# Patient Record
Sex: Female | Born: 1990 | Race: White | Hispanic: No | Marital: Single | Smoking: Former smoker
Health system: Southern US, Community
[De-identification: ages and names within clinical notes are randomized; demographics above are authoritative.]

## PROBLEM LIST (undated history)

## (undated) DIAGNOSIS — K219 Gastro-esophageal reflux disease without esophagitis: Secondary | ICD-10-CM

## (undated) DIAGNOSIS — F419 Anxiety disorder, unspecified: Secondary | ICD-10-CM

---
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
## 2012-09-12 ENCOUNTER — Encounter: Payer: Self-pay | Admitting: *Deleted

## 2012-09-12 ENCOUNTER — Telehealth: Payer: Self-pay | Admitting: Nurse Practitioner

## 2012-09-12 ENCOUNTER — Encounter: Payer: Self-pay | Admitting: Nurse Practitioner

## 2012-09-12 ENCOUNTER — Ambulatory Visit (INDEPENDENT_AMBULATORY_CARE_PROVIDER_SITE_OTHER): Payer: BC Managed Care – PPO

## 2012-09-12 ENCOUNTER — Ambulatory Visit (INDEPENDENT_AMBULATORY_CARE_PROVIDER_SITE_OTHER): Payer: BC Managed Care – PPO | Admitting: Nurse Practitioner

## 2012-09-12 VITALS — BP 115/81 | HR 69 | Temp 98.2°F | Ht 63.5 in | Wt 143.0 lb

## 2012-09-12 DIAGNOSIS — R109 Unspecified abdominal pain: Secondary | ICD-10-CM

## 2012-09-12 DIAGNOSIS — J029 Acute pharyngitis, unspecified: Secondary | ICD-10-CM

## 2012-09-12 DIAGNOSIS — N390 Urinary tract infection, site not specified: Secondary | ICD-10-CM

## 2012-09-12 DIAGNOSIS — K5900 Constipation, unspecified: Secondary | ICD-10-CM

## 2012-09-12 LAB — POCT URINALYSIS DIPSTICK
Bilirubin, UA: NEGATIVE
Ketones, UA: NEGATIVE
Leukocytes, UA: NEGATIVE
pH, UA: 8

## 2012-09-12 LAB — POCT UA - MICROSCOPIC ONLY
Mucus, UA: NEGATIVE
WBC, Ur, HPF, POC: NEGATIVE

## 2012-09-12 NOTE — Telephone Encounter (Signed)
Appt made for today

## 2012-09-12 NOTE — Telephone Encounter (Signed)
New note faxed to pt's work

## 2012-09-12 NOTE — Progress Notes (Signed)
  Subjective:    Patient ID: Gabrielle Weber, female    DOB: May 13, 1990, 21 y.o.   MRN: [id number]  HPI patient in today with 2 complaints: 1. Sore throat- started yesterday- Only the right side 2.Left flank pain- started 2 days ago- No dysuria- no frequecy    Review of Systems  Constitutional: Negative for fever.  HENT: Positive for sore throat (right side only). Negative for ear pain, congestion, rhinorrhea, postnasal drip and sinus pressure.   Eyes: Negative.   Respiratory: Negative.   Cardiovascular: Negative.   Gastrointestinal: Positive for abdominal pain (left flank).  Genitourinary: Negative for dysuria, urgency and frequency.       Objective:   Physical Exam  Constitutional: She appears well-developed and well-nourished.  HENT:  Right Ear: Hearing, tympanic membrane, external ear and ear canal normal.  Left Ear: Hearing, tympanic membrane, external ear and ear canal normal.  Nose: Mucosal edema and rhinorrhea present. Right sinus exhibits no maxillary sinus tenderness and no frontal sinus tenderness. Left sinus exhibits no maxillary sinus tenderness and no frontal sinus tenderness.  Mouth/Throat: Posterior oropharyngeal erythema present. Posterior oropharyngeal edema: right side only.  Eyes: Conjunctivae are normal. Pupils are equal, round, and reactive to light.  Neck: Normal range of motion. Neck supple.  Cardiovascular: Normal rate and normal heart sounds.   Pulmonary/Chest: Effort normal and breath sounds normal.  Abdominal: Soft. There is no tenderness.  Genitourinary:  No CVA tenderness  Lymphadenopathy:    She has no cervical adenopathy.  BP 115/81  Pulse 69  Temp(Src) 98.2 F (36.8 C) (Oral)  Ht 5' 3.5" (1.613 m)  Wt 143 lb (64.864 kg)  BMI 24.93 kg/m2  LMP 08/30/2012   KUB- Moderate amount os stool in distal colon-Preliminary reading by Paulene Floor, FNP  [facility]       Assessment & Plan:  1. UTI (urinary tract infection) Negative UTI Force fluids -  POCT UA - Microscopic Only - POCT urinalysis dipstick - POCT rapid strep A  2. Sore throat Viral Cold beverages, ice cream and popsicles Throat lozgenses Motrin or tylenol OTC if help - POCT rapid strep A  3. Left flank pain  - DG Abd 1 View; Future  4. Constipation MOM OTC and prune juice Force fluids RTO PRN  Mary-Margaret Daphine Deutscher, FNP

## 2012-09-12 NOTE — Patient Instructions (Signed)

## 2012-11-26 ENCOUNTER — Encounter: Payer: Self-pay | Admitting: Family Medicine

## 2012-11-26 ENCOUNTER — Telehealth: Payer: Self-pay | Admitting: Nurse Practitioner

## 2012-11-26 ENCOUNTER — Ambulatory Visit (INDEPENDENT_AMBULATORY_CARE_PROVIDER_SITE_OTHER): Payer: BC Managed Care – PPO | Admitting: Family Medicine

## 2012-11-26 VITALS — BP 132/86 | HR 82 | Temp 97.0°F | Ht 63.5 in | Wt 148.0 lb

## 2012-11-26 DIAGNOSIS — J322 Chronic ethmoidal sinusitis: Secondary | ICD-10-CM

## 2012-11-26 DIAGNOSIS — J029 Acute pharyngitis, unspecified: Secondary | ICD-10-CM

## 2012-11-26 LAB — POCT RAPID STREP A (OFFICE): Rapid Strep A Screen: NEGATIVE

## 2012-11-26 MED ORDER — AZITHROMYCIN 250 MG PO TABS: ORAL_TABLET | ORAL | Status: DC

## 2012-11-26 NOTE — Telephone Encounter (Signed)
Appt given for today 

## 2012-11-26 NOTE — Patient Instructions (Signed)
Take medication as directed Take Tylenol or Advil for aches pains or fever Drink plenty of fluid Take Claritin or Allegra as needed for head congestion Take Mucinex maximum strength one twice daily with a large glass of water

## 2012-11-26 NOTE — Progress Notes (Signed)
  Subjective:    Patient ID: Gabrielle Weber, female    DOB: 04-27-1990, 22 y.o.   MRN: [id number]  HPI Patient has had sinus congestion and sore throat for about 2 days. There's been no fever. She has had a productive cough which occasionally has yellow sputum. The predominant problem is mostly head congestion. Slight ear pain both ears.   Review of Systems  Constitutional: Negative for fever and chills.  HENT: Positive for congestion, sneezing, postnasal drip and sinus pressure.   Eyes: Negative.   Respiratory: Negative.   Cardiovascular: Negative.   Gastrointestinal: Negative.   Endocrine: Negative.   Genitourinary: Negative.   Musculoskeletal: Negative.   Skin: Negative.   Allergic/Immunologic: Negative.   Neurological: Negative.   Hematological: Negative.   Psychiatric/Behavioral: Negative.        Objective:   Physical Exam  Nursing note and vitals reviewed. Constitutional: She is oriented to person, place, and time. She appears well-developed and well-nourished. She appears distressed.  HENT:  Head: Atraumatic.  Right Ear: External ear normal.  Left Ear: External ear normal.  Throat was slightly red posteriorly right greater than left There is nasal congestion bilaterally, there was right and left frontal sinus tenderness as well as ethmoid tenderness.  Eyes: Right eye exhibits no discharge. Left eye exhibits no discharge. No scleral icterus.  Neck: Normal range of motion. Neck supple.  Cardiovascular: Normal rate and regular rhythm.   Pulmonary/Chest: Effort normal and breath sounds normal. No respiratory distress. She has no wheezes. She has no rales.  Musculoskeletal: Normal range of motion.  Lymphadenopathy:    She has cervical adenopathy (cervical adenopathy bilaterally right greater than left).  Neurological: She is alert and oriented to person, place, and time.  Skin: Skin is warm and dry. No rash noted.  Psychiatric: She has a normal mood and affect. Her behavior  is normal. Judgment and thought content normal.  Malaise   Results for orders placed in visit on 11/26/12  POCT RAPID STREP A (OFFICE)      Result Value Range   Rapid Strep A Screen Negative  Negative        Assessment & Plan:  1. Sore throat - POCT rapid strep A - Strep A culture, throat  2. Ethmoid sinusitis - azithromycin (ZITHROMAX) 250 MG tablet; 2 tabs the first day then one daily  Dispense: 6 tablet; Refill: 0  Patient Instructions  Take medication as directed Take Tylenol or Advil for aches pains or fever Drink plenty of fluid Take Claritin or Allegra as needed for head congestion Take Mucinex maximum strength one twice daily with a large glass of water   Nyra Capes MD

## 2012-11-30 LAB — STREP A CULTURE, THROAT

## 2012-12-04 ENCOUNTER — Telehealth: Payer: Self-pay | Admitting: *Deleted

## 2012-12-04 MED ORDER — FLUCONAZOLE 150 MG PO TABS: ORAL_TABLET | ORAL | Status: DC

## 2012-12-04 NOTE — Telephone Encounter (Signed)
Recently took Zpack for strep and believes she has yeast infection.  Has white discharge and it is itching.  Can we please call something in?

## 2012-12-04 NOTE — Telephone Encounter (Signed)
Diflucan 150 #2  tablets one stat and one one week later

## 2012-12-19 ENCOUNTER — Ambulatory Visit (INDEPENDENT_AMBULATORY_CARE_PROVIDER_SITE_OTHER): Payer: BC Managed Care – PPO | Admitting: Nurse Practitioner

## 2012-12-19 ENCOUNTER — Telehealth: Payer: Self-pay | Admitting: Nurse Practitioner

## 2012-12-19 VITALS — BP 125/81 | HR 79 | Temp 99.0°F | Ht 63.5 in | Wt 149.0 lb

## 2012-12-19 DIAGNOSIS — T2013XA Burn of first degree of chin, initial encounter: Secondary | ICD-10-CM

## 2012-12-19 NOTE — Progress Notes (Signed)
  Subjective:    Patient ID: Gabrielle Weber, female    DOB: 22-Aug-1990, 21 y.o.   MRN: [id number]  HPI Patient had some dental work done yesterday- was sprayed on face and in right eye with chloraphorm- SHe went to the eye Dr. Isidore Moos this morning and now wants face looked out.    Review of Systems  All other systems reviewed and are negative.       Objective:   Physical Exam  Constitutional: She appears well-developed and well-nourished.  HENT:  Head:    Cardiovascular: Normal rate, regular rhythm and normal heart sounds.   Pulmonary/Chest: Effort normal and breath sounds normal.  Skin:  Line of small vesicular lesions on right chin     BP 125/81  Pulse 79  Temp(Src) 99 F (37.2 C) (Oral)  Ht 5' 3.5" (1.613 m)  Wt 149 lb (67.586 kg)  BMI 25.98 kg/m2  LMP 11/26/2012      Assessment & Plan:   1. Burn of chin, first degree, initial encounter    Silvadene cream Do not pick at area RTO prn  Mary-Margaret Daphine Deutscher, FNP

## 2012-12-19 NOTE — Telephone Encounter (Signed)
Appt given for today 

## 2012-12-19 NOTE — Patient Instructions (Signed)

## 2013-12-02 ENCOUNTER — Encounter: Payer: Self-pay | Admitting: Family Medicine

## 2013-12-02 ENCOUNTER — Ambulatory Visit: Payer: Self-pay | Admitting: Family Medicine

## 2013-12-02 VITALS — BP 115/73 | HR 70 | Temp 97.3°F | Ht 63.25 in | Wt 158.0 lb

## 2013-12-02 DIAGNOSIS — J069 Acute upper respiratory infection, unspecified: Secondary | ICD-10-CM

## 2013-12-02 MED ORDER — AZITHROMYCIN 250 MG PO TABS: ORAL_TABLET | ORAL | Status: DC

## 2013-12-02 NOTE — Progress Notes (Signed)
   Subjective:    Patient ID: Gabrielle Weber, female    DOB: 1991/03/14, 23 y.o.   MRN: [id number]  HPI This 23 y.o. female presents for evaluation of uri sx's for over a week.   Review of Systems C/o uri sx's No chest pain, SOB, HA, dizziness, vision change, N/V, diarrhea, constipation, dysuria, urinary urgency or frequency, myalgias, arthralgias or rash.     Objective:   Physical Exam  Vital signs noted  Well developed well nourished female.  HEENT - Head atraumatic Normocephalic                Eyes - PERRLA, Conjuctiva - clear Sclera- Clear EOMI                Ears - EAC's Wnl TM's Wnl Gross Hearing WNL                Throat - oropharanx wnl Respiratory - Lungs CTA bilateral Cardiac - RRR S1 and S2 without murmur GI - Abdomen soft Nontender and bowel sounds active x 4 Extremities - No edema. Neuro - Grossly intact.      Assessment & Plan:  URI (upper respiratory infection) - Plan: azithromycin (ZITHROMAX) 250 MG tablet Push po fluids, rest, tylenol and motrin otc prn as directed for fever, arthralgias, and myalgias.  Follow up prn if sx's continue or persist.  Deatra Canter FNP

## 2014-04-16 ENCOUNTER — Encounter: Payer: Self-pay | Admitting: Family

## 2014-04-16 ENCOUNTER — Ambulatory Visit: Payer: Self-pay | Admitting: Family

## 2014-04-16 VITALS — BP 120/78 | HR 70 | Temp 97.6°F | Ht 63.25 in | Wt 162.8 lb

## 2014-04-16 DIAGNOSIS — N390 Urinary tract infection, site not specified: Secondary | ICD-10-CM

## 2014-04-16 DIAGNOSIS — R309 Painful micturition, unspecified: Secondary | ICD-10-CM

## 2014-04-16 LAB — POCT UA - MICROSCOPIC ONLY
Casts, Ur, LPF, POC: NEGATIVE
Crystals, Ur, HPF, POC: NEGATIVE
MUCUS UA: NEGATIVE
RBC, URINE, MICROSCOPIC: NEGATIVE
YEAST UA: NEGATIVE

## 2014-04-16 LAB — POCT URINALYSIS DIPSTICK
BILIRUBIN UA: NEGATIVE
Glucose, UA: NEGATIVE
KETONES UA: NEGATIVE
NITRITE UA: NEGATIVE
PH UA: 8
PROTEIN UA: NEGATIVE
Urobilinogen, UA: NEGATIVE

## 2014-04-16 MED ORDER — NITROFURANTOIN MONOHYD MACRO 100 MG PO CAPS: 100.0000 mg | ORAL_CAPSULE | Freq: Two times a day (BID) | ORAL | Status: DC

## 2014-04-16 NOTE — Progress Notes (Signed)
   Subjective:    Patient ID: Gabrielle Weber, female    DOB: October 05, 1990, 24 y.o.   MRN: [id number]  Dysuria  This is a new problem. The current episode started in the past 7 days (Four days ago). The problem occurs intermittently. The problem has been unchanged. The quality of the pain is described as stabbing. The pain is at a severity of 3/10. Associated symptoms include a discharge, flank pain, frequency, nausea and urgency. Pertinent negatives include no hematuria, hesitancy or vomiting. She has tried increased fluids for the symptoms. The treatment provided mild relief.      Review of Systems  Constitutional: Negative.   HENT: Negative.   Eyes: Negative.   Respiratory: Negative.  Negative for shortness of breath.   Cardiovascular: Negative.  Negative for palpitations.  Gastrointestinal: Positive for nausea. Negative for vomiting.  Endocrine: Negative.   Genitourinary: Positive for dysuria, urgency, frequency and flank pain. Negative for hesitancy and hematuria.  Musculoskeletal: Negative.   Neurological: Negative.  Negative for headaches.  Hematological: Negative.   Psychiatric/Behavioral: Negative.   All other systems reviewed and are negative.      Objective:   Physical Exam  Constitutional: She is oriented to person, place, and time. She appears well-developed and well-nourished. No distress.  Cardiovascular: Normal rate, regular rhythm, normal heart sounds and intact distal pulses.   No murmur heard. Pulmonary/Chest: Effort normal and breath sounds normal. No respiratory distress. She has no wheezes.  Abdominal: Soft. Bowel sounds are normal. She exhibits no distension. There is no tenderness.  Musculoskeletal: Normal range of motion. She exhibits no edema or tenderness.  Neg for CVA tenderness   Neurological: She is alert and oriented to person, place, and time. She has normal reflexes. No cranial nerve deficit.  Skin: Skin is warm and dry.  Psychiatric: She has a normal  mood and affect. Her behavior is normal. Judgment and thought content normal.  Vitals reviewed.     BP 120/78 mmHg  Pulse 70  Temp(Src) 97.6 F (36.4 C) (Oral)  Ht 5' 3.25" (1.607 m)  Wt 162 lb 12.8 oz (73.846 kg)  BMI 28.60 kg/m2  LMP 04/02/2014     Assessment & Plan:  1. Pain with urination - POCT urinalysis dipstick - POCT UA - Microscopic Only  2. Urinary tract infection without hematuria, site unspecified -Force fluids AZO over the counter X2 days RTO prn - nitrofurantoin, macrocrystal-monohydrate, (MACROBID) 100 MG capsule; Take 1 capsule (100 mg total) by mouth 2 (two) times daily.  Dispense: 10 capsule; Refill: 0  Jannifer Rodneyhristy Korbin Notaro, FNP

## 2014-04-16 NOTE — Patient Instructions (Signed)

## 2014-04-21 ENCOUNTER — Encounter: Payer: Self-pay | Admitting: Family

## 2014-04-21 ENCOUNTER — Ambulatory Visit (INDEPENDENT_AMBULATORY_CARE_PROVIDER_SITE_OTHER): Payer: BLUE CROSS/BLUE SHIELD | Admitting: Family

## 2014-04-21 VITALS — BP 124/87 | HR 88 | Temp 98.2°F | Ht 63.25 in | Wt 159.6 lb

## 2014-04-21 DIAGNOSIS — J029 Acute pharyngitis, unspecified: Secondary | ICD-10-CM

## 2014-04-21 LAB — POCT RAPID STREP A (OFFICE): Rapid Strep A Screen: NEGATIVE

## 2014-04-21 MED ORDER — AZITHROMYCIN 250 MG PO TABS: ORAL_TABLET | ORAL | Status: DC

## 2014-04-21 NOTE — Progress Notes (Signed)
Subjective:    Patient ID: Gabrielle DarterMegan Weber, female    DOB: [birth year], 24 y.o.   MRN: [id number]  Sore Throat  This is a new problem. The current episode started in the past 7 days (Friday). The problem has been gradually worsening. Neither side of throat is experiencing more pain than the other. There has been no fever. The pain is at a severity of 7/10. The pain is mild. Associated symptoms include congestion, ear pain, headaches, swollen glands and trouble swallowing. Pertinent negatives include no coughing, ear discharge or shortness of breath. She has had exposure to strep. She has had no exposure to mono. Treatments tried: Mucinex. The treatment provided mild relief.      Review of Systems  Constitutional: Negative.   HENT: Positive for congestion, ear pain and trouble swallowing. Negative for ear discharge.   Eyes: Negative.   Respiratory: Negative.  Negative for cough and shortness of breath.   Cardiovascular: Negative.  Negative for palpitations.  Gastrointestinal: Negative.   Endocrine: Negative.   Genitourinary: Negative.   Musculoskeletal: Negative.   Neurological: Positive for headaches.  Hematological: Negative.   Psychiatric/Behavioral: Negative.   All other systems reviewed and are negative.      Objective:   Physical Exam  Constitutional: She is oriented to person, place, and time. She appears well-developed and well-nourished. No distress.  HENT:  Head: Normocephalic and atraumatic.  Right Ear: External ear normal.  Left Ear: External ear normal.  Mouth/Throat: Oropharyngeal exudate present.  Nasal passage erythemas with mild swelling    Eyes: Pupils are equal, round, and reactive to light.  Neck: Normal range of motion. Neck supple. No thyromegaly present.  Cardiovascular: Normal rate, regular rhythm, normal heart sounds and intact distal pulses.   No murmur heard. Pulmonary/Chest: Effort normal and breath sounds normal. No respiratory distress. She has no  wheezes.  Abdominal: Soft. Bowel sounds are normal. She exhibits no distension. There is no tenderness.  Musculoskeletal: Normal range of motion. She exhibits no edema or tenderness.  Neurological: She is alert and oriented to person, place, and time. She has normal reflexes. No cranial nerve deficit.  Skin: Skin is warm and dry.  Psychiatric: She has a normal mood and affect. Her behavior is normal. Judgment and thought content normal.  Vitals reviewed.     BP 124/87 mmHg  Pulse 88  Temp(Src) 98.2 F (36.8 C) (Oral)  Ht 5' 3.25" (1.607 m)  Wt 159 lb 9.6 oz (72.394 kg)  BMI 28.03 kg/m2  LMP 04/02/2014     Assessment & Plan:  1. Sore throat - POCT rapid strep A - azithromycin (ZITHROMAX) 250 MG tablet; Take 500 mg once, then 250 mg for four days  Dispense: 6 tablet; Refill: 0  2. Acute pharyngitis, unspecified pharyngitis type -- Take meds as prescribed - Use a cool mist humidifier  -Use saline nose sprays frequently -Saline irrigations of the nose can be very helpful if done frequently.  * 4X daily for 1 week*  * Use of a nettie pot can be helpful with this. Follow directions with this* -Force fluids -For any cough or congestion  Use plain Mucinex- regular strength or max strength is fine   * Children- consult with Pharmacist for dosing -For fever or aces or pains- take tylenol or ibuprofen appropriate for age and weight.  * for fevers greater than 101 orally you may alternate ibuprofen and tylenol every  3 hours. -Throat lozenges if help -New toothbrush in 3 days - azithromycin (  ZITHROMAX) 250 MG tablet; Take 500 mg once, then 250 mg for four days  Dispense: 6 tablet; Refill: 0  Jannifer Rodneyhristy Arlynn Stare, FNP

## 2014-04-21 NOTE — Patient Instructions (Addendum)
Pharyngitis Pharyngitis is redness, pain, and swelling (inflammation) of your pharynx.  CAUSES  Pharyngitis is usually caused by infection. Most of the time, these infections are from viruses (viral) and are part of a cold. However, sometimes pharyngitis is caused by bacteria (bacterial). Pharyngitis can also be caused by allergies. Viral pharyngitis may be spread from person to person by coughing, sneezing, and personal items or utensils (cups, forks, spoons, toothbrushes). Bacterial pharyngitis may be spread from person to person by more intimate contact, such as kissing.  SIGNS AND SYMPTOMS  Symptoms of pharyngitis include:   Sore throat.   Tiredness (fatigue).   Low-grade fever.   Headache.  Joint pain and muscle aches.  Skin rashes.  Swollen lymph nodes.  Plaque-like film on throat or tonsils (often seen with bacterial pharyngitis). DIAGNOSIS  Your health care provider will ask you questions about your illness and your symptoms. Your medical history, along with a physical exam, is often all that is needed to diagnose pharyngitis. Sometimes, a rapid strep test is done. Other lab tests may also be done, depending on the suspected cause.  TREATMENT  Viral pharyngitis will usually get better in 3-4 days without the use of medicine. Bacterial pharyngitis is treated with medicines that kill germs (antibiotics).  HOME CARE INSTRUCTIONS   Drink enough water and fluids to keep your urine clear or pale yellow.   Only take over-the-counter or prescription medicines as directed by your health care provider:   If you are prescribed antibiotics, make sure you finish them even if you start to feel better.   Do not take aspirin.   Get lots of rest.   Gargle with 8 oz of salt water ( tsp of salt per 1 qt of water) as often as every 1-2 hours to soothe your throat.   Throat lozenges (if you are not at risk for choking) or sprays may be used to soothe your throat. SEEK MEDICAL  CARE IF:   You have large, tender lumps in your neck.  You have a rash.  You cough up green, yellow-brown, or bloody spit. SEEK IMMEDIATE MEDICAL CARE IF:   Your neck becomes stiff.  You drool or are unable to swallow liquids.  You vomit or are unable to keep medicines or liquids down.  You have severe pain that does not go away with the use of recommended medicines.  You have trouble breathing (not caused by a stuffy nose). MAKE SURE YOU:   Understand these instructions.  Will watch your condition.  Will get help right away if you are not doing well or get worse. Document Released: 03/07/2005 Document Revised: 12/26/2012 Document Reviewed: 11/12/2012 [employer] Patient Information 2015 [employer]. This information is not intended to replace advice given to you by your health care provider. Make sure you discuss any questions you have with your health care provider.  - Take meds as prescribed - Use a cool mist humidifier  -Use saline nose sprays frequently -Saline irrigations of the nose can be very helpful if done frequently.  * 4X daily for 1 week*  * Use of a nettie pot can be helpful with this. Follow directions with this* -Force fluids -For any cough or congestion  Use plain Mucinex- regular strength or max strength is fine   * Children- consult with Pharmacist for dosing -For fever or aces or pains- take tylenol or ibuprofen appropriate for age and weight.  * for fevers greater than 101 orally you may alternate ibuprofen and tylenol every    3 hours. -Throat lozenges if help -New toothbrush in 3 days   Ticia Virgo, FNP  

## 2014-05-19 ENCOUNTER — Encounter: Payer: Self-pay | Admitting: Nurse Practitioner

## 2014-05-19 ENCOUNTER — Ambulatory Visit (INDEPENDENT_AMBULATORY_CARE_PROVIDER_SITE_OTHER): Payer: BLUE CROSS/BLUE SHIELD | Admitting: Nurse Practitioner

## 2014-05-19 VITALS — BP 125/87 | HR 84 | Temp 97.2°F | Ht 63.0 in | Wt 166.0 lb

## 2014-05-19 DIAGNOSIS — J029 Acute pharyngitis, unspecified: Secondary | ICD-10-CM

## 2014-05-19 LAB — POCT RAPID STREP A (OFFICE): RAPID STREP A SCREEN: NEGATIVE

## 2014-05-19 NOTE — Progress Notes (Signed)
  Subjective:     Gabrielle Weber is a 24 y.o. female who presents for evaluation of sore throat. Associated symptoms include nasal blockage, post nasal drip, sinus and nasal congestion and sore throat. Onset of symptoms was 5 days ago, and have been stable since that time. She is drinking moderate amounts of fluids. She has not had a recent close exposure to someone with proven streptococcal pharyngitis.    Review of Systems A comprehensive review of systems was negative except for: Ears, nose, mouth, throat, and face: positive for earaches, nasal congestion, sore throat and nasal discharge.    Objective:    BP 125/87 mmHg  Pulse 84  Temp(Src) 97.2 F (36.2 C) (Oral)  Ht 5\' 3"  (1.6 m)  Wt 166 lb (75.297 kg)  BMI 29.41 kg/m2 General appearance: alert, cooperative and appears stated age Eyes: conjunctivae/corneas clear. PERRL, EOM's intact. Fundi benign. Ears: normal TM's and external ear canals both ears Nose: moderate congestion, turbinates red, inflamed, no sinus tenderness Throat: lips, mucosa, and tongue normal; teeth and gums normal and +2 bilateral tonsil enlargement, injected, uvula midline.  Neck: mild anterior cervical adenopathy and supple, symmetrical, trachea midline Lungs: clear to auscultation bilaterally  Laboratory Results for orders placed or performed in visit on 05/19/14  POCT rapid strep A  Result Value Ref Range   Rapid Strep A Screen Negative Negative    Strep test done. Results:negative.    Assessment:     Viral pharyngitis.    BP 125/87 mmHg  Pulse 84  Temp(Src) 97.2 F (36.2 C) (Oral)  Ht 5\' 3"  (1.6 m)  Wt 166 lb (75.297 kg)  BMI 29.41 kg/m2  Plan:  Force fluids Motrin or tylenol OTC OTC decongestant Throat lozenges if help New toothbrush in 3 days  Mary-Margaret Daphine DeutscherMartin, FNP

## 2014-05-19 NOTE — Patient Instructions (Signed)

## 2014-10-22 ENCOUNTER — Encounter: Payer: Self-pay | Admitting: Family

## 2014-10-22 ENCOUNTER — Ambulatory Visit (INDEPENDENT_AMBULATORY_CARE_PROVIDER_SITE_OTHER): Payer: BLUE CROSS/BLUE SHIELD | Admitting: Family

## 2014-10-22 ENCOUNTER — Encounter (INDEPENDENT_AMBULATORY_CARE_PROVIDER_SITE_OTHER): Payer: Self-pay

## 2014-10-22 VITALS — BP 123/82 | HR 79 | Temp 98.7°F | Ht 63.0 in | Wt 162.0 lb

## 2014-10-22 DIAGNOSIS — N946 Dysmenorrhea, unspecified: Secondary | ICD-10-CM | POA: Diagnosis not present

## 2014-10-22 NOTE — Progress Notes (Signed)
   Subjective:    Patient ID: Gabrielle Weber, female    DOB: [birth year], 24 y.o.   MRN: [id number]  HPI Pt presents to the office today with menstrual cramps. Pt states her cramps were very painful this AM about a 8 out 10. Pt states she had to miss work. Pt states her period is usually slightly painful, but this morning was worse than usual/. Pt states she feels better now. Pt states she took Motrin which "took the edge off". Pt states her menstrual cycle usually last 3-4 days with heavy bleeding. PT states her mother had Endometriosis    Review of Systems  Constitutional: Negative.   HENT: Negative.   Eyes: Negative.   Respiratory: Negative.  Negative for shortness of breath.   Cardiovascular: Negative.  Negative for palpitations.  Gastrointestinal: Negative.   Endocrine: Negative.   Genitourinary: Negative.   Musculoskeletal: Negative.   Neurological: Negative.  Negative for headaches.  Hematological: Negative.   Psychiatric/Behavioral: Negative.   All other systems reviewed and are negative.      Objective:   Physical Exam  Constitutional: She is oriented to person, place, and time. She appears well-developed and well-nourished. No distress.  HENT:  Head: Normocephalic and atraumatic.  Right Ear: External ear normal.  Left Ear: External ear normal.  Nose: Nose normal.  Mouth/Throat: Oropharynx is clear and moist.  Eyes: Pupils are equal, round, and reactive to light.  Neck: Normal range of motion. Neck supple. No thyromegaly present.  Cardiovascular: Normal rate, regular rhythm, normal heart sounds and intact distal pulses.   No murmur heard. Pulmonary/Chest: Effort normal and breath sounds normal. No respiratory distress. She has no wheezes.  Abdominal: Soft. Bowel sounds are normal. She exhibits no distension. There is no tenderness.  Musculoskeletal: Normal range of motion. She exhibits no edema or tenderness.  Neurological: She is alert and oriented to person, place, and  time. She has normal reflexes. No cranial nerve deficit.  Skin: Skin is warm and dry.  Psychiatric: She has a normal mood and affect. Her behavior is normal. Judgment and thought content normal.  Vitals reviewed.   Blood pressure 123/82, pulse 79, temperature 98.7 F (37.1 C), temperature source Oral, height  (1.6 m), weight 162 lb (73.483 kg).       Assessment & Plan:  1. Dysmenorrhea -Pt to take Tylenol or Motrin as needed -Massage lower back -Heating pad as needed -If continue pt may need transvaginal ultrasound? -RTO prn  Jannifer Rodney, FNP

## 2014-10-22 NOTE — Patient Instructions (Signed)
Endometriosis Endometriosis is a condition in which the tissue that lines the uterus (endometrium) grows outside of its normal location. The tissue may grow in many locations close to the uterus, but it commonly grows on the ovaries, fallopian tubes, vagina, or bowel. Because the uterus expels, or sheds, its lining every menstrual cycle, there is bleeding wherever the endometrial tissue is located. This can cause pain because blood is irritating to tissues not normally exposed to it.  CAUSES  The cause of endometriosis is not known.  SIGNS AND SYMPTOMS  Often, there are no symptoms. When symptoms are present, they can vary with the location of the displaced tissue. Various symptoms can occur at different times. Although symptoms occur mainly during a woman's menstrual period, they can also occur midcycle and usually stop with menopause. Some people may go months with no symptoms at all. Symptoms may include:   Back or abdominal pain.   Heavier bleeding during periods.   Pain during intercourse.   Painful bowel movements.   Infertility. DIAGNOSIS  Your health care provider will do a physical exam and ask about your symptoms. Various tests may be done, such as:   Blood tests and urine tests. These are done to help rule out other problems.   Ultrasound. This test is done to look for abnormal tissue.   An X-ray of the lower bowel (barium enema).  Laparoscopy. In this procedure, a thin, lighted tube with a tiny camera on the end (laparoscope) is inserted into your abdomen. This helps your health care provider look for abnormal tissue to confirm the diagnosis. The health care provider may also remove a small piece of tissue (biopsy) from any abnormal tissue found. This tissue sample can then be sent to a lab so it can be looked at under a microscope. TREATMENT  Treatment will vary and may include:   Medicines to relieve pain. Nonsteroidal anti-inflammatory drugs (NSAIDs) are a type of  pain medicine that can help to relieve the pain caused by endometriosis.  Hormonal therapy. When using hormonal therapy, periods are eliminated. This eliminates the monthly exposure to blood by the displaced endometrial tissue.   Surgery. Surgery may sometimes be done to remove the abnormal endometrial tissue. In severe cases, surgery may be done to remove the fallopian tubes, uterus, and ovaries (hysterectomy). HOME CARE INSTRUCTIONS   Take all medicines as directed by your health care provider. Do not take aspirin because it may increase bleeding when you are not on hormonal therapy.   Avoid activities that produce pain, including sexual activity. SEEK MEDICAL CARE IF:  You have pelvic pain before, after, or during your periods.  You have pelvic pain between periods that gets worse during your period.  You have pelvic pain during or after sex.  You have pelvic pain with bowel movements or urination, especially during your period.  You have problems getting pregnant.  You have a fever. SEEK IMMEDIATE MEDICAL CARE IF:   Your pain is severe and is not responding to pain medicine.   You have severe nausea and vomiting, or you cannot keep foods down.   You have pain that is limited to the right lower part of your abdomen.   You have swelling or increasing pain in your abdomen.   You see blood in your stool.  MAKE SURE YOU:   Understand these instructions.  Will watch your condition.  Will get help right away if you are not doing well or get worse. Document Released: 03/04/2000 Document  Revised: 07/22/2013 Document Reviewed: 11/02/2012 [facility] Patient Information 2015 [place], [place]. This information is not intended to replace advice given to you by your health care provider. Make sure you discuss any questions you have with your health care provider. Dysmenorrhea Menstrual cramps (dysmenorrhea) are caused by the muscles of the uterus tightening (contracting) during a  menstrual period. For some women, this discomfort is merely bothersome. For others, dysmenorrhea can be severe enough to interfere with everyday activities for a few days each month. Primary dysmenorrhea is menstrual cramps that last a couple of days when you start having menstrual periods or soon after. This often begins after a teenager starts having her period. As a woman gets older or has a baby, the cramps will usually lessen or disappear. Secondary dysmenorrhea begins later in life, lasts longer, and the pain may be stronger than primary dysmenorrhea. The pain may start before the period and last a few days after the period.  CAUSES  Dysmenorrhea is usually caused by an underlying problem, such as:  The tissue lining the uterus grows outside of the uterus in other areas of the body (endometriosis).  The endometrial tissue, which normally lines the uterus, is found in or grows into the muscular walls of the uterus (adenomyosis).  The pelvic blood vessels are engorged with blood just before the menstrual period (pelvic congestive syndrome).  Overgrowth of cells (polyps) in the lining of the uterus or cervix.  Falling down of the uterus (prolapse) because of loose or stretched ligaments.  Depression.  Bladder problems, infection, or inflammation.  Problems with the intestine, a tumor, or irritable bowel syndrome.  Cancer of the female organs or bladder.  A severely tipped uterus.  A very tight opening or closed cervix.  Noncancerous tumors of the uterus (fibroids).  Pelvic inflammatory disease (PID).  Pelvic scarring (adhesions) from a previous surgery.  Ovarian cyst.  An intrauterine device (IUD) used for birth control. RISK FACTORS You may be at greater risk of dysmenorrhea if:  You are younger than age 15.  You started puberty early.  You have irregular or heavy bleeding.  You have never given birth.  You have a family history of this problem.  You are a  smoker. SIGNS AND SYMPTOMS   Cramping or throbbing pain in your lower abdomen.  Headaches.  Lower back pain.  Nausea or vomiting.  Diarrhea.  Sweating or dizziness.  Loose stools. DIAGNOSIS  A diagnosis is based on your history, symptoms, physical exam, diagnostic tests, or procedures. Diagnostic tests or procedures may include:  Blood tests.  Ultrasonography.  An examination of the lining of the uterus (dilation and curettage, D&C).  An examination inside your abdomen or pelvis with a scope (laparoscopy).  X-rays.  CT scan.  MRI.  An examination inside the bladder with a scope (cystoscopy).  An examination inside the intestine or stomach with a scope (colonoscopy, gastroscopy). TREATMENT  Treatment depends on the cause of the dysmenorrhea. Treatment may include:  Pain medicine prescribed by your health care provider.  Birth control pills or an IUD with progesterone hormone in it.  Hormone replacement therapy.  Nonsteroidal anti-inflammatory drugs (NSAIDs). These may help stop the production of prostaglandins.  Surgery to remove adhesions, endometriosis, ovarian cyst, or fibroids.  Removal of the uterus (hysterectomy).  Progesterone shots to stop the menstrual period.  Cutting the nerves on the sacrum that go to the female organs (presacral neurectomy).  Electric current to the sacral nerves (sacral nerve stimulation).  Antidepressant medicine.  Psychiatric therapy, counseling, or group therapy.  Exercise and physical therapy.  Meditation and yoga therapy.  Acupuncture. HOME CARE INSTRUCTIONS   Only take over-the-counter or prescription medicines as directed by your health care provider.  Place a heating pad or hot water bottle on your lower back or abdomen. Do not sleep with the heating pad.  Use aerobic exercises, walking, swimming, biking, and other exercises to help lessen the cramping.  Massage to the lower back or abdomen may  help.  Stop smoking.  Avoid alcohol and caffeine. SEEK MEDICAL CARE IF:   Your pain does not get better with medicine.  You have pain with sexual intercourse.  Your pain increases and is not controlled with medicines.  You have abnormal vaginal bleeding with your period.  You develop nausea or vomiting with your period that is not controlled with medicine. SEEK IMMEDIATE MEDICAL CARE IF:  You pass out.  Document Released: 03/07/2005 Document Revised: 11/07/2012 Document Reviewed: 08/23/2012 [facility] Patient Information 2015 [place], [place]. This information is not intended to replace advice given to you by your health care provider. Make sure you discuss any questions you have with your health care provider.

## 2015-01-06 ENCOUNTER — Ambulatory Visit (INDEPENDENT_AMBULATORY_CARE_PROVIDER_SITE_OTHER): Payer: BLUE CROSS/BLUE SHIELD | Admitting: Family Medicine

## 2015-01-06 VITALS — BP 121/79 | HR 75 | Temp 97.9°F | Ht 63.0 in | Wt 165.4 lb

## 2015-01-06 DIAGNOSIS — J029 Acute pharyngitis, unspecified: Secondary | ICD-10-CM

## 2015-01-06 DIAGNOSIS — J020 Streptococcal pharyngitis: Secondary | ICD-10-CM

## 2015-01-06 LAB — POCT RAPID STREP A (OFFICE): Rapid Strep A Screen: POSITIVE — AB

## 2015-01-06 MED ORDER — AZITHROMYCIN 250 MG PO TABS: ORAL_TABLET | ORAL | Status: DC

## 2015-01-06 NOTE — Patient Instructions (Signed)

## 2015-01-06 NOTE — Progress Notes (Signed)
   HPI  Patient presents today here with ear pain and sore throat.  Patient explains that she's been sick for about 2 days. She describes slow onset sore throat, bilateral ear pain, and headache today with a little bit of dizziness. She denies nausea, dyspnea, cough, or other symptoms. She states that she was still able to work today but does have some malaise. Her Tmax is 99.8  PMH: Smoking status noted ROS: Per HPI  Objective: BP 121/79 mmHg  Pulse 75  Temp(Src) 97.9 F (36.6 C) (Oral)  Ht 5\' 3"  (1.6 m)  Wt 165 lb 6.4 oz (75.025 kg)  BMI 29.31 kg/m2 Gen: NAD, alert, cooperative with exam HEENT: NCAT, TMs normal bilaterally, left sided tonsil is large with no exudates, nares clear Neck: Nontender lymphadenopathy CV: RRR, good S1/S2, no murmur Resp: CTABL, no wheezes, non-labored Neuro: Alert and oriented, No gross deficits  Assessment and plan:  # Strep pharyngitis Treat with azithromycin, patient has previous intolerance of penicillins  Letter written for 1 day out of work Supportive care with fluids and Tylenol as needed    Murtis SinkSam Bradshaw, MD [facility] 01/06/2015, 5:44 PM

## 2015-02-09 ENCOUNTER — Encounter: Payer: Self-pay | Admitting: Family Medicine

## 2015-02-09 ENCOUNTER — Encounter (INDEPENDENT_AMBULATORY_CARE_PROVIDER_SITE_OTHER): Payer: Self-pay

## 2015-02-09 ENCOUNTER — Ambulatory Visit (INDEPENDENT_AMBULATORY_CARE_PROVIDER_SITE_OTHER): Payer: BLUE CROSS/BLUE SHIELD | Admitting: Family Medicine

## 2015-02-09 VITALS — BP 122/84 | HR 60 | Temp 97.4°F | Ht 63.0 in | Wt 164.4 lb

## 2015-02-09 DIAGNOSIS — J029 Acute pharyngitis, unspecified: Secondary | ICD-10-CM

## 2015-02-09 LAB — POCT RAPID STREP A (OFFICE): Rapid Strep A Screen: NEGATIVE

## 2015-02-09 MED ORDER — GUAIFENESIN-CODEINE 100-10 MG/5ML PO SYRP: 5.0000 mL | ORAL_SOLUTION | ORAL | Status: DC | PRN

## 2015-02-09 MED ORDER — AZITHROMYCIN 250 MG PO TABS: ORAL_TABLET | ORAL | Status: DC

## 2015-02-09 NOTE — Progress Notes (Signed)
Subjective:  Patient ID: Gabrielle Weber, female    DOB: [birth year]  Age: 24 y.o. MRN: [id number]  CC: Sore Throat   HPI Gabrielle Weber presents for Patient presents with  moderate sore throat. Patient reports coughing frequently as well.no sputum noted. There is no fever no chills no sweats. The patient denies being short of breath. Onset wasa few days ago. Gradually worsening in spite of home remedies. Had similar sx last month and was dx as strep.Z pack cleared it.   History Gabrielle Weber has no past medical history on file.   She has no past surgical history on file.   Her family history includes Fibromyalgia in her mother; Hypertension in her father.She reports that she has been smoking Cigarettes.  She has been smoking about 0.50 packs per day. She does not have any smokeless tobacco history on file. She reports that she does not drink alcohol or use illicit drugs.  Outpatient Prescriptions Prior to Visit  Medication Sig Dispense Refill  . azithromycin (ZITHROMAX) 250 MG tablet Take 2 tablets on day 1 and 1 tablet daily after that (Patient not taking: Reported on 02/09/2015) 6 tablet 0   No facility-administered medications prior to visit.    ROS Review of Systems  Constitutional: Negative for fever, chills, activity change and appetite change.  HENT: Positive for ear pain and sore throat. Negative for congestion, ear discharge, hearing loss, nosebleeds, postnasal drip, rhinorrhea, sinus pressure and sneezing.   Respiratory: Positive for cough. Negative for chest tightness and shortness of breath.   Cardiovascular: Negative for chest pain and palpitations.  Skin: Negative for rash.    Objective:  BP 122/84 mmHg  Pulse 60  Temp(Src) 97.4 F (36.3 C) (Oral)  Ht 5\' 3"  (1.6 m)  Wt 164 lb 6.4 oz (74.571 kg)  BMI 29.13 kg/m2  SpO2 98%  LMP 01/20/2015  BP Readings from Last 3 Encounters:  02/09/15 122/84  01/06/15 121/79  10/22/14 123/82    Wt Readings from Last 3 Encounters:    02/09/15 164 lb 6.4 oz (74.571 kg)  01/06/15 165 lb 6.4 oz (75.025 kg)  10/22/14 162 lb (73.483 kg)     Physical Exam  Constitutional: She appears well-developed and well-nourished.  HENT:  Head: Normocephalic and atraumatic.  Right Ear: Tympanic membrane and external ear normal. No decreased hearing is noted.  Left Ear: Tympanic membrane and external ear normal. No decreased hearing is noted.  Nose: Mucosal edema present. Right sinus exhibits no frontal sinus tenderness. Left sinus exhibits no frontal sinus tenderness.  Mouth/Throat: No oropharyngeal exudate or posterior oropharyngeal erythema.  Eyes: Pupils are equal, round, and reactive to light. Right eye exhibits no discharge. Left eye exhibits no discharge.  Neck: Normal range of motion. Neck supple. No Brudzinski's sign noted.  Cardiovascular: Normal rate, regular rhythm and normal heart sounds.   No murmur heard. Pulmonary/Chest: Breath sounds normal. No respiratory distress. She has no wheezes. She has no rales.  Lymphadenopathy:       Head (right side): No preauricular adenopathy present.       Head (left side): No preauricular adenopathy present.    She has no cervical adenopathy.       Right cervical: No superficial cervical adenopathy present.      Left cervical: No superficial cervical adenopathy present.    No results found for: HGBA1C  No results found for: WBC, HGB, HCT, PLT, GLUCOSE, CHOL, TRIG, HDL, LDLDIRECT, LDLCALC, ALT, AST, NA, K, CL, CREATININE, BUN, CO2, TSH, PSA, INR,  GLUF, HGBA1C, MICROALBUR  No results found.  Assessment & Plan:   Gabrielle Weber was seen today for sore throat.  Diagnoses and all orders for this visit:  Sore throat -     POCT rapid strep A  Other orders -     azithromycin (ZITHROMAX) 250 MG tablet; Take 2 tablets on day 1 and 1 tablet daily after that -     guaiFENesin-codeine (CHERATUSSIN AC) 100-10 MG/5ML syrup; Take 5 mLs by mouth every 4 (four) hours as needed for cough.   I am  having Gabrielle Weber start on guaiFENesin-codeine. I am also having her maintain her azithromycin.  Meds ordered this encounter  Medications  . azithromycin (ZITHROMAX) 250 MG tablet    Sig: Take 2 tablets on day 1 and 1 tablet daily after that    Dispense:  6 tablet    Refill:  0  . guaiFENesin-codeine (CHERATUSSIN AC) 100-10 MG/5ML syrup    Sig: Take 5 mLs by mouth every 4 (four) hours as needed for cough.    Dispense:  180 mL    Refill:  0     Follow-up: Return if symptoms worsen or fail to improve.  Mechele Claude, M.D.

## 2015-09-03 ENCOUNTER — Ambulatory Visit (INDEPENDENT_AMBULATORY_CARE_PROVIDER_SITE_OTHER): Payer: BLUE CROSS/BLUE SHIELD | Admitting: Family Medicine

## 2015-09-03 ENCOUNTER — Encounter: Payer: Self-pay | Admitting: Family Medicine

## 2015-09-03 VITALS — BP 126/77 | HR 73 | Temp 98.1°F | Ht 63.0 in | Wt 169.0 lb

## 2015-09-03 DIAGNOSIS — L209 Atopic dermatitis, unspecified: Secondary | ICD-10-CM

## 2015-09-03 DIAGNOSIS — R29898 Other symptoms and signs involving the musculoskeletal system: Secondary | ICD-10-CM | POA: Diagnosis not present

## 2015-09-03 MED ORDER — TRIAMCINOLONE ACETONIDE 0.5 % EX OINT: 1.0000 "application " | TOPICAL_OINTMENT | Freq: Two times a day (BID) | CUTANEOUS | Status: DC

## 2015-09-03 NOTE — Patient Instructions (Signed)
Great to see you!  Try gentle stretches 2-3 times daily for 2-3 weeks, we will be glad to further evaluate if you do not get better or get worse.   For your skin:  I have sent a topical steroid ointment It appears that you have eczema, with the scaling and silvery appearance psoriasis is a possibility but I would not consider it seriously with this small lesion. Please let us know if anything else crops up

## 2015-09-03 NOTE — Progress Notes (Signed)
   HPI  Patient presents today here is hip tightness and rash on the elbows.  Hip tightness Is been going on for about 3-4 weeks, she states that it's her right anterior hip, whenever she is abducting at a 90 angle or lying completely flat she has anterior hip tightness, stating that it feels like it will break, however does not hurt severely. If she is patient relaxes and she can move her leg normally, however when she adducts again she has pain. She has no injury at  onset  Rash Noticed today Bilateral elbows with fleshy papules and silvery scale Her mothers worried about psoriasis No other lesions similar to this one.  Does have a white lesion with no scale on her right upper forearm   PMH: Smoking status noted ROS: Per HPI  Objective: BP 126/77 mmHg  Pulse 73  Temp(Src) 98.1 F (36.7 C) (Oral)  Ht 5\' 3"  (1.6 m)  Wt 169 lb (76.658 kg)  BMI 29.94 kg/m2  LMP 08/13/2015 Gen: NAD, alert, cooperative with exam HEENT: NCAT CV: RRR, good S1/S2, no murmur Resp: CTABL, no wheezes, non-labored Ext: No edema, warm Neuro: Alert and oriented, No gross deficits MSK Negative Pearlean BrownieFaber, reproduction of symptoms with leg outstretched flat and abduction at the hip  Skin Hyperkeratotic papules that are flesh-colored on bilateral elbows with silvery scale  Assessment and plan:  # Hip tightness I suspect muscle tightness, possibly adductor muscle tightness causing pain with abduction With her silvery rash would consider psoriasis and psoriatic arthritis if not improved as expected Reassurance provided, recommended gentle stretching Return to clinic in 2-3 weeks if not improving, plan plain films and follow-up on rash. Consider psoriasis further  # Rash, atopic dermatitis Likely atopic dermatitis, however she does have silvery scale Treating with topical steroids Consider psoriasis, as well as psoriatic arthritis with hip symptom as above, however lesion is quite small and hip  tightness/pain is mild Watchful waiting     Meds ordered this encounter  Medications  . triamcinolone ointment (KENALOG) 0.5 %    Sig: Apply 1 application topically 2 (two) times daily.    Dispense:  30 g    Refill:  0    Murtis SinkSam Akiem Urieta, MD [facility] 09/03/2015, 6:06 PM

## 2015-09-30 ENCOUNTER — Ambulatory Visit (INDEPENDENT_AMBULATORY_CARE_PROVIDER_SITE_OTHER): Payer: BLUE CROSS/BLUE SHIELD | Admitting: Family Medicine

## 2015-09-30 ENCOUNTER — Ambulatory Visit (INDEPENDENT_AMBULATORY_CARE_PROVIDER_SITE_OTHER): Payer: BLUE CROSS/BLUE SHIELD

## 2015-09-30 ENCOUNTER — Encounter: Payer: Self-pay | Admitting: Family Medicine

## 2015-09-30 VITALS — BP 109/81 | HR 68 | Temp 97.8°F | Ht 63.0 in | Wt 167.8 lb

## 2015-09-30 DIAGNOSIS — M25551 Pain in right hip: Secondary | ICD-10-CM

## 2015-09-30 MED ORDER — MELOXICAM 7.5 MG PO TABS: 7.5000 mg | ORAL_TABLET | Freq: Every day | ORAL | Status: DC

## 2015-09-30 NOTE — Progress Notes (Signed)
   Subjective:    Patient ID: Bartolo DarterMegan Maule, female    DOB: 11-27-1990, 25 y.o.   MRN: [id number]  HPI  Patient is here today for right hip/leg pain. Patient states that when she walks she has a burning sensation that goes into the calf when she walks. Symptoms have not changed since she was here previously she has not done the stretching exercises because it hurts. I think the Pain is probably some tightness not related to the hip pain and we talked about some stretching to do for that  Review of Systems  Constitutional: Negative.   HENT: Negative.   Eyes: Negative.   Respiratory: Negative.   Cardiovascular: Negative.   Gastrointestinal: Negative.   Endocrine: Negative.   Genitourinary: Negative.   Musculoskeletal:       Right hip and leg pain    Skin: Negative.   Allergic/Immunologic: Negative.   Neurological: Negative.   Hematological: Negative.   Psychiatric/Behavioral: Negative.      Dep[facility] screen [facility] 09/30/2015 09/03/2015 02/09/2015  Decreased Interest 0 0 0  Down, Depressed, Hopeless 0 0 0  PHQ - 2 Score 0 0 0        There are no active problems to display for this patient.  Outpatient Encounter Prescriptions as of 09/30/2015  Medication Sig  . triamcinolone ointment (KENALOG) 0.5 % Apply 1 application topically 2 (two) times daily.   No facility-administered encounter medications on file as of 09/30/2015.       Objective:   Physical Exam  Constitutional: She appears well-developed and well-nourished.  Musculoskeletal:  Right hip: Pain is anterior where the leg meets the hip. It increases with abduction. She is able to flex and extend the leg normally. There is also pain with internal rotation X-ray of the pelvis shows no bony abnormality    BP 109/81 mmHg  Pulse 68  Temp(Src) 97.8 F (36.6 C) (Oral)  Ht 5\' 3"  (1.6 m)  Wt 167 lb 12.8 oz (76.114 kg)  BMI 29.73 kg/m2  LMP 09/13/2015       Assessment & Plan:  1. 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 There've been no clothing her weight over the area. I suggested we try meloxicam but we will also arrange for appointment sports medicine for consultation - DG Pelvis 1-2 Views; Future - meloxicam (MOBIC) 7.5 MG tablet; Take 1 tablet (7.5 mg total) by mouth daily.  Dispense: 15 tablet; Refill: 0 - Ambulatory referral to Sports Medicine  Frederica KusterStephen M Miller MD

## 2015-10-08 ENCOUNTER — Encounter: Payer: Self-pay | Admitting: Sports Medicine

## 2015-10-08 ENCOUNTER — Ambulatory Visit (INDEPENDENT_AMBULATORY_CARE_PROVIDER_SITE_OTHER): Payer: BLUE CROSS/BLUE SHIELD | Admitting: Sports Medicine

## 2015-10-08 VITALS — BP 139/75 | Ht 63.0 in | Wt 165.0 lb

## 2015-10-08 DIAGNOSIS — M25551 Pain in right hip: Secondary | ICD-10-CM | POA: Diagnosis not present

## 2015-10-08 DIAGNOSIS — M25559 Pain in unspecified hip: Secondary | ICD-10-CM | POA: Insufficient documentation

## 2015-10-08 NOTE — Progress Notes (Signed)
    Reason For Visit: Right Hip Pain   # Patient with hx of 2 months of hip pain. Patient states no issues with walking, pain only with  abduction and with sideway lunges. She denies any inciting event. Does not do any specific exercises. Patient was seen by PCP and given Mobic for pain, however did not take it.  ROS- No swelling, no erythema, no radiation of pain, or numbness.   Past Medical History - No hx of surgeries or medical issues with hip  Reviewed problem list.  Medications- reviewed and updated No additions to family history Social history- patient is a non-smoker   Objective: BP 139/75 mmHg  Ht 5\' 3"  (1.6 m)  Wt 165 lb (04.54074.844 kg)  BMI 29.24 kg/m2  LMP 09/13/2015 Gen: NAD, alert, cooperative with exam Right Hip: No abnormalities noted on inspection, normal leg length, no pain with palpation of hip joint, normal range of motion pain elicited with abduction of right hip. 5 out of 5 strength throughout. FABER and FADIR positive for pain.   Left Hip: No abnormalities noted on inspection, no pain with palpation, normal range of motion, 5 out of 5 strength.  Ultrasound of Right Hip: No obvious tears or hypoechoic change/fluid collection involving the left hip flexors or adductors.  Joint looks normal with no effusion.  Labrum ant appears intact.  Assessment/Plan: See problem based a/p   Hip pain Likely muscular strain, normal ultrasound negative for labral tear  - Will provided hip strengthening exercises  - Follow up in 6 weeks if not improved.

## 2015-10-08 NOTE — Assessment & Plan Note (Addendum)
Likely muscular strain, normal ultrasound negative for labral tear  - Will provided hip strengthening exercises   We reassured patient as she seemed concerned that she could have a tumor or something serious triggering the pain  OK to use the Mobic  - Follow up in 6 weeks if not improved.

## 2015-10-08 NOTE — Patient Instructions (Signed)
Do daily exercises as discussed and follow up in 6 weeks if no improvement

## 2015-11-10 ENCOUNTER — Ambulatory Visit (INDEPENDENT_AMBULATORY_CARE_PROVIDER_SITE_OTHER): Payer: BLUE CROSS/BLUE SHIELD | Admitting: Family

## 2015-11-10 ENCOUNTER — Encounter: Payer: Self-pay | Admitting: Family

## 2015-11-10 VITALS — BP 119/85 | HR 74 | Temp 97.8°F | Ht 63.0 in | Wt 160.8 lb

## 2015-11-10 DIAGNOSIS — J309 Allergic rhinitis, unspecified: Secondary | ICD-10-CM | POA: Diagnosis not present

## 2015-11-10 DIAGNOSIS — G5602 Carpal tunnel syndrome, left upper limb: Secondary | ICD-10-CM | POA: Diagnosis not present

## 2015-11-10 DIAGNOSIS — F411 Generalized anxiety disorder: Secondary | ICD-10-CM

## 2015-11-10 MED ORDER — FLUTICASONE PROPIONATE 50 MCG/ACT NA SUSP: 2.0000 | Freq: Every day | NASAL | 6 refills | Status: DC

## 2015-11-10 MED ORDER — BUSPIRONE HCL 10 MG PO TABS: 10.0000 mg | ORAL_TABLET | Freq: Two times a day (BID) | ORAL | 1 refills | Status: DC

## 2015-11-10 NOTE — Patient Instructions (Signed)

## 2015-11-10 NOTE — Progress Notes (Signed)
Subjective:    Patient ID: Gabrielle DarterMegan Weber, female    DOB: [birth year], 25 y.o.   MRN: [id number]  Otalgia   There is pain in the left ear. This is a new problem. The current episode started in the past 7 days. The problem occurs every few minutes. The problem has been waxing and waning. There has been no fever. The pain is at a severity of 2/10. The pain is mild. Pertinent negatives include no coughing, diarrhea, ear discharge, hearing loss, neck pain, rhinorrhea or sore throat. Treatments tried: claritin. The treatment provided mild relief.  Anxiety  Presents for initial visit. Onset was 1 to 6 months ago. The problem has been waxing and waning. Symptoms include depressed mood, excessive worry, irritability, nervous/anxious behavior and obsessions. Patient reports no shortness of breath or suicidal ideas. Symptoms occur occasionally. The symptoms are aggravated by specific phobias. The quality of sleep is fair.   Her past medical history is significant for anxiety/panic attacks and depression. Past treatments include nothing. The treatment provided no relief.   Pt is complaining of left wrist pain that comes and goes that started a months ago. PT states she has achy pain of 3 out 10. 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    Review of Systems  Constitutional: Positive for irritability.  HENT: Positive for ear pain and sinus pressure. Negative for ear discharge, hearing loss, rhinorrhea and sore throat.   Respiratory: Negative for cough and shortness of breath.   Gastrointestinal: Negative for diarrhea.  Musculoskeletal: Positive for arthralgias (left wrist pain). Negative for neck pain.  Psychiatric/Behavioral: Negative for suicidal ideas. The patient is nervous/anxious.   All other systems reviewed and are negative.      Objective:   Physical Exam  Constitutional: She is oriented to person, place, and time. She appears well-developed and well-nourished. No distress.  HENT:  Head:  Normocephalic and atraumatic.  Right Ear: External ear normal.  Left Ear: External ear normal.  Mouth/Throat: Oropharynx is clear and moist.  Nasal passage erythemas with mild swelling   Neck: Normal range of motion. Neck supple. No thyromegaly present.  Cardiovascular: Normal rate, regular rhythm, normal heart sounds and intact distal pulses.   No murmur heard. Pulmonary/Chest: Effort normal and breath sounds normal. No respiratory distress. She has no wheezes.  Abdominal: Soft. Bowel sounds are normal. She exhibits no distension. There is no tenderness.  Musculoskeletal: Normal range of motion. She exhibits no edema or tenderness.  Negative tinel and phalen  Neurological: She is alert and oriented to person, place, and time.  Skin: Skin is warm and dry.  Psychiatric: She has a normal mood and affect. Her behavior is normal. Judgment and thought content normal.  Vitals reviewed.     BP 119/85   Pulse 74   Temp 97.8 F (36.6 C) (Oral)   Ht 5\' 3"  (1.6 m)   Wt 160 lb 12.8 oz (72.9 kg)   BMI 28.48 kg/m      Assessment & Plan:  1. Allergic rhinitis, unspecified allergic rhinitis type -Continue Claritin daily - fluticasone (FLONASE) 50 MCG/ACT nasal spray; Place 2 sprays into both nostrils daily.  Dispense: 16 g; Refill: 6  2. GAD (generalized anxiety disorder) Pt started on buspar today. Pt does not want to be on anything she "takes daily". PT refuses lexapro. -Stress management discussed - busPIRone (BUSPAR) 10 MG tablet; Take 1 tablet (10 mg total) by mouth 2 (two) times daily.  Dispense: 30 tablet; Refill: 1  3. Carpal tunnel syndrome of left wrist _Motrin prn Brace as needed   Jannifer Rodneyhristy Hawks, FNP

## 2015-11-16 ENCOUNTER — Telehealth: Payer: Self-pay

## 2015-11-16 MED ORDER — MOMETASONE FUROATE 50 MCG/ACT NA SUSP: 2.0000 | Freq: Every day | NASAL | 12 refills | Status: DC

## 2015-11-16 NOTE — Telephone Encounter (Signed)
Nasonex Prescription sent to pharmacy. Insurance denied [employer]

## 2015-12-24 ENCOUNTER — Encounter: Payer: Self-pay | Admitting: Family

## 2015-12-24 ENCOUNTER — Ambulatory Visit (INDEPENDENT_AMBULATORY_CARE_PROVIDER_SITE_OTHER): Payer: BLUE CROSS/BLUE SHIELD | Admitting: Family

## 2015-12-24 VITALS — BP 123/89 | HR 98 | Temp 99.6°F | Ht 63.0 in | Wt 159.6 lb

## 2015-12-24 DIAGNOSIS — J029 Acute pharyngitis, unspecified: Secondary | ICD-10-CM | POA: Diagnosis not present

## 2015-12-24 DIAGNOSIS — J020 Streptococcal pharyngitis: Secondary | ICD-10-CM

## 2015-12-24 LAB — RAPID STREP SCREEN (MED CTR MEBANE ONLY): Strep Gp A Ag, IA W/Reflex: POSITIVE — AB

## 2015-12-24 MED ORDER — AZITHROMYCIN 250 MG PO TABS: ORAL_TABLET | ORAL | 0 refills | Status: DC

## 2015-12-24 NOTE — Patient Instructions (Signed)

## 2015-12-24 NOTE — Progress Notes (Signed)
   Subjective:    Patient ID: Gabrielle Weber, female    DOB: 1990/10/01, 25 y.o.   MRN: [id number]  Sore Throat   This is a new problem. The current episode started in the past 7 days. The problem has been gradually worsening. The pain is worse on the left side. Maximum temperature: 99.06. The pain is at a severity of 7/10. The pain is moderate. Associated symptoms include ear pain, a hoarse voice, swollen glands and trouble swallowing. Pertinent negatives include no congestion, ear discharge or headaches. She has tried acetaminophen for the symptoms. The treatment provided mild relief.      Review of Systems  HENT: Positive for ear pain, hoarse voice and trouble swallowing. Negative for congestion and ear discharge.   Neurological: Negative for headaches.  All other systems reviewed and are negative.      Objective:   Physical Exam  Constitutional: She is oriented to person, place, and time. She appears well-developed and well-nourished. No distress.  HENT:  Head: Normocephalic and atraumatic.  Right Ear: External ear normal.  Left Ear: External ear normal.  Nose: Mucosal edema and rhinorrhea present.  Mouth/Throat: Oropharyngeal exudate, posterior oropharyngeal edema and posterior oropharyngeal erythema present.  Eyes: Pupils are equal, round, and reactive to light.  Neck: Normal range of motion. Neck supple. No thyromegaly present.  Cardiovascular: Normal rate, regular rhythm, normal heart sounds and intact distal pulses.   No murmur heard. Pulmonary/Chest: Effort normal and breath sounds normal. No respiratory distress. She has no wheezes.  Abdominal: Soft. Bowel sounds are normal. She exhibits no distension. There is no tenderness.  Musculoskeletal: Normal range of motion. She exhibits no edema or tenderness.  Neurological: She is alert and oriented to person, place, and time. She has normal reflexes. No cranial nerve deficit.  Skin: Skin is warm and dry.  Psychiatric: She has a  normal mood and affect. Her behavior is normal. Judgment and thought content normal.  Vitals reviewed.     BP 123/89   Pulse 98   Temp 99.6 F (37.6 C) (Oral)   Ht 5\' 3"  (1.6 m)   Wt 159 lb 9.6 oz (72.4 kg)   BMI 28.27 kg/m      Assessment & Plan:  1. Sore throat - [facility])  2. Streptococcal sore throat -- Take meds as prescribed - Use a cool mist humidifier  -Use saline nose sprays frequently -Saline irrigations of the nose can be very helpful if done frequently.  * 4X daily for 1 week*  * Use of a nettie pot can be helpful with this. Follow directions with this* -Force fluids -For any cough or congestion  Use plain Mucinex- regular strength or max strength is fine   * Children- consult with Pharmacist for dosing -For fever or aces or pains- take tylenol or ibuprofen appropriate for age and weight.  * for fevers greater than 101 orally you may alternate ibuprofen and tylenol every  3 hours. -Throat lozenges if help -New toothbrush in 3 days - azithromycin (ZITHROMAX) 250 MG tablet; Take 500 mg once, then 250 mg for four days  Dispense: 6 tablet; Refill: 0  Jannifer Rodneyhristy Abbigayle Toole, FNP

## 2016-05-11 ENCOUNTER — Ambulatory Visit (INDEPENDENT_AMBULATORY_CARE_PROVIDER_SITE_OTHER): Payer: BLUE CROSS/BLUE SHIELD | Admitting: Family Medicine

## 2016-05-11 ENCOUNTER — Encounter: Payer: Self-pay | Admitting: Family Medicine

## 2016-05-11 VITALS — BP 115/81 | HR 57 | Temp 97.5°F | Ht 63.0 in | Wt 165.0 lb

## 2016-05-11 DIAGNOSIS — J0101 Acute recurrent maxillary sinusitis: Secondary | ICD-10-CM

## 2016-05-11 DIAGNOSIS — J020 Streptococcal pharyngitis: Secondary | ICD-10-CM

## 2016-05-11 MED ORDER — AZITHROMYCIN 250 MG PO TABS: ORAL_TABLET | ORAL | 0 refills | Status: DC

## 2016-05-11 NOTE — Progress Notes (Signed)
   Subjective:  Patient ID: Bartolo DarterMegan Cadotte, female    DOB: 05/07/1990  Age: 26 y.o. MRN: [id number]  CC: Sinusitis (pt here today c/o nasal congestion, sneezing, ear fullnes, sinus headache and sinus pressure)   HPI Bartolo DarterMegan Sharrow presents for Symptoms include congestion, facial pain, nasal congestion, no  fever, non productive cough, post nasal drip and sinus pressure with no fever, chills, night sweats or weight loss. Onset of symptoms was a few days ago, gradually worsening since that time. Pt.is drinking moderate amounts of fluids.     History Aundra MilletMegan has no past medical history on file.   She has no past surgical history on file.   Her family history includes Fibromyalgia in her mother; Hypertension in her father.She reports that she quit smoking about 15 months ago. Her smoking use included Cigarettes. She smoked 0.50 packs per day. She has never used smokeless tobacco. She reports that she does not drink alcohol or use drugs.  No current outpatient prescriptions on file prior to visit.   No current facility-administered medications on file prior to visit.     ROS Review of Systems  Constitutional: Negative for activity change, appetite change, chills and fever.  HENT: Positive for congestion, postnasal drip, rhinorrhea and sinus pressure. Negative for ear discharge, ear pain, hearing loss, nosebleeds, sneezing and trouble swallowing.   Respiratory: Negative for chest tightness and shortness of breath.   Cardiovascular: Negative for chest pain and palpitations.  Skin: Negative for rash.    Objective:  BP 115/81   Pulse (!) 57   Temp 97.5 F (36.4 C) (Oral)   Ht 5\' 3"  (1.6 m)   Wt 165 lb (74.8 kg)   LMP 04/27/2016 (Approximate)   BMI 29.23 kg/m   Physical Exam  Constitutional: She appears well-developed and well-nourished.  HENT:  Head: Normocephalic and atraumatic.  Right Ear: Tympanic membrane and external ear normal. No decreased hearing is noted.  Left Ear: Tympanic  membrane and external ear normal. No decreased hearing is noted.  Nose: Mucosal edema present. Right sinus exhibits no frontal sinus tenderness. Left sinus exhibits no frontal sinus tenderness.  Mouth/Throat: No oropharyngeal exudate or posterior oropharyngeal erythema.  Neck: No Brudzinski's sign noted.  Pulmonary/Chest: Breath sounds normal. No respiratory distress.  Lymphadenopathy:       Head (right side): No preauricular adenopathy present.       Head (left side): No preauricular adenopathy present.       Right cervical: No superficial cervical adenopathy present.      Left cervical: No superficial cervical adenopathy present.    Assessment & Plan:   Aundra MilletMegan was seen today for sinusitis.  Diagnoses and all orders for this visit:  Acute recurrent maxillary sinusitis  Streptococcal sore throat -     azithromycin (ZITHROMAX) 250 MG tablet; Take 500 mg once, then 250 mg for four days   I have discontinued Ms. Gunnoe's triamcinolone ointment and busPIRone. I am also having her maintain her azithromycin.  Meds ordered this encounter  Medications  . azithromycin (ZITHROMAX) 250 MG tablet    Sig: Take 500 mg once, then 250 mg for four days    Dispense:  6 tablet    Refill:  0     Follow-up: Return if symptoms worsen or fail to improve.  Mechele ClaudeWarren Chloey Ricard, M.D.

## 2016-06-29 DIAGNOSIS — Z880 Allergy status to penicillin: Secondary | ICD-10-CM | POA: Diagnosis not present

## 2016-06-29 DIAGNOSIS — R509 Fever, unspecified: Secondary | ICD-10-CM | POA: Diagnosis not present

## 2016-06-29 DIAGNOSIS — R000 Tachycardia, unspecified: Secondary | ICD-10-CM | POA: Diagnosis not present

## 2016-06-29 DIAGNOSIS — Z883 Allergy status to other anti-infective agents status: Secondary | ICD-10-CM | POA: Diagnosis not present

## 2016-06-29 DIAGNOSIS — B349 Viral infection, unspecified: Secondary | ICD-10-CM | POA: Diagnosis not present

## 2016-06-29 DIAGNOSIS — R197 Diarrhea, unspecified: Secondary | ICD-10-CM | POA: Diagnosis not present

## 2016-06-29 DIAGNOSIS — Z882 Allergy status to sulfonamides status: Secondary | ICD-10-CM | POA: Diagnosis not present

## 2016-06-29 DIAGNOSIS — Z87891 Personal history of nicotine dependence: Secondary | ICD-10-CM | POA: Diagnosis not present

## 2016-06-30 DIAGNOSIS — M791 Myalgia: Secondary | ICD-10-CM | POA: Diagnosis not present

## 2016-06-30 DIAGNOSIS — Z880 Allergy status to penicillin: Secondary | ICD-10-CM | POA: Diagnosis not present

## 2016-06-30 DIAGNOSIS — F419 Anxiety disorder, unspecified: Secondary | ICD-10-CM | POA: Diagnosis not present

## 2016-06-30 DIAGNOSIS — R197 Diarrhea, unspecified: Secondary | ICD-10-CM | POA: Diagnosis not present

## 2016-06-30 DIAGNOSIS — Z87891 Personal history of nicotine dependence: Secondary | ICD-10-CM | POA: Diagnosis not present

## 2016-06-30 DIAGNOSIS — Z882 Allergy status to sulfonamides status: Secondary | ICD-10-CM | POA: Diagnosis not present

## 2016-06-30 DIAGNOSIS — A020 Salmonella enteritis: Secondary | ICD-10-CM | POA: Diagnosis not present

## 2016-06-30 DIAGNOSIS — R1084 Generalized abdominal pain: Secondary | ICD-10-CM | POA: Diagnosis not present

## 2016-07-28 ENCOUNTER — Encounter: Payer: Self-pay | Admitting: Family

## 2016-07-28 ENCOUNTER — Ambulatory Visit (INDEPENDENT_AMBULATORY_CARE_PROVIDER_SITE_OTHER): Payer: BLUE CROSS/BLUE SHIELD | Admitting: Family

## 2016-07-28 VITALS — BP 130/85 | HR 106 | Temp 98.5°F | Ht 63.0 in | Wt 159.2 lb

## 2016-07-28 DIAGNOSIS — F411 Generalized anxiety disorder: Secondary | ICD-10-CM | POA: Diagnosis not present

## 2016-07-28 MED ORDER — ALPRAZOLAM 0.25 MG PO TABS: 0.2500 mg | ORAL_TABLET | Freq: Two times a day (BID) | ORAL | 2 refills | Status: DC | PRN

## 2016-07-28 MED ORDER — ESCITALOPRAM OXALATE 10 MG PO TABS: 10.0000 mg | ORAL_TABLET | Freq: Every day | ORAL | 5 refills | Status: DC

## 2016-07-28 NOTE — Progress Notes (Signed)
   Subjective:    Patient ID: Gabrielle DarterMegan Weber, female    DOB: 1991-01-11, 26 y.o.   MRN: [id number]  Pt presents to the office today to check BP and Anxiety. Pt states she took her BP with her grandfather's wrist BP machine and it was 145/111. PT states she feels anxious and feels this is causing her BP to be elevated.  Anxiety  Presents for follow-up visit. Symptoms include depressed mood, excessive worry, irritability, nervous/anxious behavior and restlessness. Patient reports no panic. Symptoms occur occasionally.        Review of Systems  Constitutional: Positive for irritability.  Psychiatric/Behavioral: The patient is nervous/anxious.   All other systems reviewed and are negative.      Objective:   Physical Exam  Constitutional: She is oriented to person, place, and time. She appears well-developed and well-nourished. No distress.  HENT:  Head: Normocephalic and atraumatic.  Eyes: Pupils are equal, round, and reactive to light.  Neck: Normal range of motion. Neck supple. No thyromegaly present.  Cardiovascular: Normal rate, regular rhythm, normal heart sounds and intact distal pulses.   No murmur heard. Pulmonary/Chest: Effort normal and breath sounds normal. No respiratory distress. She has no wheezes.  Abdominal: Soft. Bowel sounds are normal. She exhibits no distension. There is no tenderness.  Musculoskeletal: Normal range of motion. She exhibits no edema or tenderness.  Neurological: She is alert and oriented to person, place, and time.  Skin: Skin is warm and dry.  Psychiatric: She has a normal mood and affect. Her behavior is normal. Judgment and thought content normal.  Tearful   Vitals reviewed.     BP 130/85   Pulse (!) 106   Temp 98.5 F (36.9 C) (Oral)   Ht 5\' 3"  (1.6 m)   Wt 159 lb 3.2 oz (72.2 kg)   BMI 28.20 kg/m      Assessment & Plan:  1. GAD (generalized anxiety disorder) PT getting married this weekend and leaving for a cruise next week  Will  give pt Xanax rx small dose and only to use as needed!!!!!! Pt started on lexapro 10 mg today Stress management discussed RTO in 6 weeks  - escitalopram (LEXAPRO) 10 MG tablet; Take 1 tablet (10 mg total) by mouth daily.  Dispense: 30 tablet; Refill: 5 - ALPRAZolam (XANAX) 0.25 MG tablet; Take 1 tablet (0.25 mg total) by mouth 2 (two) times daily as needed for anxiety.  Dispense: 20 tablet; Refill: 2   Jannifer Rodneyhristy Kaedyn Belardo, FNP

## 2016-07-28 NOTE — Patient Instructions (Signed)
Stress and Stress Management Stress is a normal reaction to life events. It is what you feel when life demands more than you are used to or more than you can handle. Some stress can be useful. For example, the stress reaction can help you catch the last bus of the day, study for a test, or meet a deadline at work. But stress that occurs too often or for too long can cause problems. It can affect your emotional health and interfere with relationships and normal daily activities. Too much stress can weaken your immune system and increase your risk for physical illness. If you already have a medical problem, stress can make it worse. What are the causes? All sorts of life events may cause stress. An event that causes stress for one person may not be stressful for another person. Major life events commonly cause stress. These may be positive or negative. Examples include losing your job, moving into a new home, getting married, having a baby, or losing a loved one. Less obvious life events may also cause stress, especially if they occur day after day or in combination. Examples include working long hours, driving in traffic, caring for children, being in debt, or being in a difficult relationship. What are the signs or symptoms? Stress may cause emotional symptoms including, the following:  Anxiety. This is feeling worried, afraid, on edge, overwhelmed, or out of control.  Anger. This is feeling irritated or impatient.  Depression. This is feeling sad, down, helpless, or guilty.  Difficulty focusing, remembering, or making decisions. Stress may cause physical symptoms, including the following:  Aches and pains. These may affect your head, neck, back, stomach, or other areas of your body.  Tight muscles or clenched jaw.  Low energy or trouble sleeping. Stress may cause unhealthy behaviors, including the following:  Eating to feel better (overeating) or skipping meals.  Sleeping too little, too  much, or both.  Working too much or putting off tasks (procrastination).  Smoking, drinking alcohol, or using drugs to feel better. How is this diagnosed? Stress is diagnosed through an assessment by your health care provider. Your health care provider will ask questions about your symptoms and any stressful life events.Your health care provider will also ask about your medical history and may order blood tests or other tests. Certain medical conditions and medicine can cause physical symptoms similar to stress. Mental illness can cause emotional symptoms and unhealthy behaviors similar to stress. Your health care provider may refer you to a mental health professional for further evaluation. How is this treated? Stress management is the recommended treatment for stress.The goals of stress management are reducing stressful life events and coping with stress in healthy ways. Techniques for reducing stressful life events include the following:  Stress identification. Self-monitor for stress and identify what causes stress for you. These skills may help you to avoid some stressful events.  Time management. Set your priorities, keep a calendar of events, and learn to say "no." These tools can help you avoid making too many commitments. Techniques for coping with stress include the following:  Rethinking the problem. Try to think realistically about stressful events rather than ignoring them or overreacting. Try to find the positives in a stressful situation rather than focusing on the negatives.  Exercise. Physical exercise can release both physical and emotional tension. The key is to find a form of exercise you enjoy and do it regularly.  Relaxation techniques. These relax the body and mind. Examples include yoga,  meditation, tai chi, biofeedback, deep breathing, progressive muscle relaxation, listening to music, being out in nature, journaling, and other hobbies. Again, the key is to find one or  more that you enjoy and can do regularly.  Healthy lifestyle. Eat a balanced diet, get plenty of sleep, and do not smoke. Avoid using alcohol or drugs to relax.  Strong support network. Spend time with family, friends, or other people you enjoy being around.Express your feelings and talk things over with someone you trust. Counseling or talktherapy with a mental health professional may be helpful if you are having difficulty managing stress on your own. Medicine is typically not recommended for the treatment of stress.Talk to your health care provider if you think you need medicine for symptoms of stress. Follow these instructions at home:  Keep all follow-up visits as directed by your health care provider.  Take all medicines as directed by your health care provider. Contact a health care provider if:  Your symptoms get worse or you start having new symptoms.  You feel overwhelmed by your problems and can no longer manage them on your own. Get help right away if:  You feel like hurting yourself or someone else. This information is not intended to replace advice given to you by your health care provider. Make sure you discuss any questions you have with your health care provider. Document Released: 08/31/2000 Document Revised: 08/13/2015 Document Reviewed: 10/30/2012 Elsevier Interactive Patient Education  2017 [employer].

## 2016-08-24 ENCOUNTER — Ambulatory Visit (INDEPENDENT_AMBULATORY_CARE_PROVIDER_SITE_OTHER): Payer: BLUE CROSS/BLUE SHIELD | Admitting: Family Medicine

## 2016-08-24 ENCOUNTER — Encounter: Payer: Self-pay | Admitting: Family Medicine

## 2016-08-24 VITALS — BP 157/89 | HR 88 | Temp 98.1°F | Ht 63.0 in | Wt 165.0 lb

## 2016-08-24 DIAGNOSIS — J0120 Acute ethmoidal sinusitis, unspecified: Secondary | ICD-10-CM | POA: Diagnosis not present

## 2016-08-24 MED ORDER — GUAIFENESIN ER 600 MG PO TB12: 600.0000 mg | ORAL_TABLET | Freq: Two times a day (BID) | ORAL | 0 refills | Status: DC

## 2016-08-24 MED ORDER — MOMETASONE FUROATE 50 MCG/ACT NA SUSP: 2.0000 | Freq: Every day | NASAL | 12 refills | Status: DC

## 2016-08-24 MED ORDER — LEVOFLOXACIN 500 MG PO TABS: 500.0000 mg | ORAL_TABLET | Freq: Every day | ORAL | 0 refills | Status: DC

## 2016-08-24 NOTE — Progress Notes (Signed)
Chief Complaint  Patient presents with  . Sinus Problem    pt here today c/o sinus pressure and congestion    HPI  Patient presents today for Symptoms include congestion, facial pain, nasal congestion, non productive cough, post nasal drip and sinus pressure. There is no fever, chills, or sweats. Onset of symptoms was a few days ago, gradually worsening since that time. Has been taking pseudoephedrine with minimal to no relief. She notes that it has been accompanied bit by some lightheadedness and feeling of being off balance. The pain and pressure in the sinuses have been focused in the area around the bridge of her nose. There has been minimal rhinorrhea.   PMH: Smoking status noted ROS: Per HPI  Objective: BP (!) 157/89   Pulse 88   Temp 98.1 F (36.7 C) (Oral)   Ht 5\' 3"  (1.6 m)   Wt 165 lb (74.8 kg)   LMP 08/21/2016 (Exact Date)   BMI 29.23 kg/m  Gen: NAD, alert, cooperative with exam HEENT: NCAT, EOMI, PERRL TMs clear pharynx without erythema. Nasal passages erythematous. Some tenderness over the bridge of the nose and over the maxillary region as well. CV: RRR, good S1/S2, no murmur Resp: CTABL, no wheezes, non-labored Abd: SNTND, BS present, no guarding or organomegaly Ext: No edema, warm Neuro: Alert and oriented, No gross deficits  Assessment and plan:  1. Acute ethmoidal sinusitis, recurrence not specified     Meds ordered this encounter  Medications  . levofloxacin (LEVAQUIN) 500 MG tablet    Sig: Take 1 tablet (500 mg total) by mouth daily. For 10 days    Dispense:  10 tablet    Refill:  0  . guaiFENesin (MUCINEX) 600 MG 12 hr tablet    Sig: Take 1 tablet (600 mg total) by mouth 2 (two) times daily.    Dispense:  20 tablet    Refill:  0  . mometasone (NASONEX) 50 MCG/ACT nasal spray    Sig: Place 2 sprays into the nose daily.    Dispense:  17 g    Refill:  12    No orders of the defined types were placed in this encounter.   Follow up as  needed.  Mechele ClaudeWarren Caiya Bettes, MD

## 2016-08-25 ENCOUNTER — Telehealth: Payer: Self-pay | Admitting: Family Medicine

## 2016-08-25 MED ORDER — AZITHROMYCIN 250 MG PO TABS: ORAL_TABLET | ORAL | 0 refills | Status: DC

## 2016-08-25 NOTE — Telephone Encounter (Signed)
Zpka Prescription sent to pharmacy

## 2016-08-25 NOTE — Telephone Encounter (Signed)
Detailed message left that rx sent to pharmacy. ?

## 2016-10-18 IMAGING — DX DG PELVIS 1-2V
1 series · 1 of 1 positions shown · non-contrast
Comparison: KUB which included a portion of the pelvis dated September 12, 2012

CLINICAL DATA: Pelvic pain greater on the right than on the left;
no report of trauma

EXAM:
PELVIS - 1-2 VIEW

[pelvis ap]
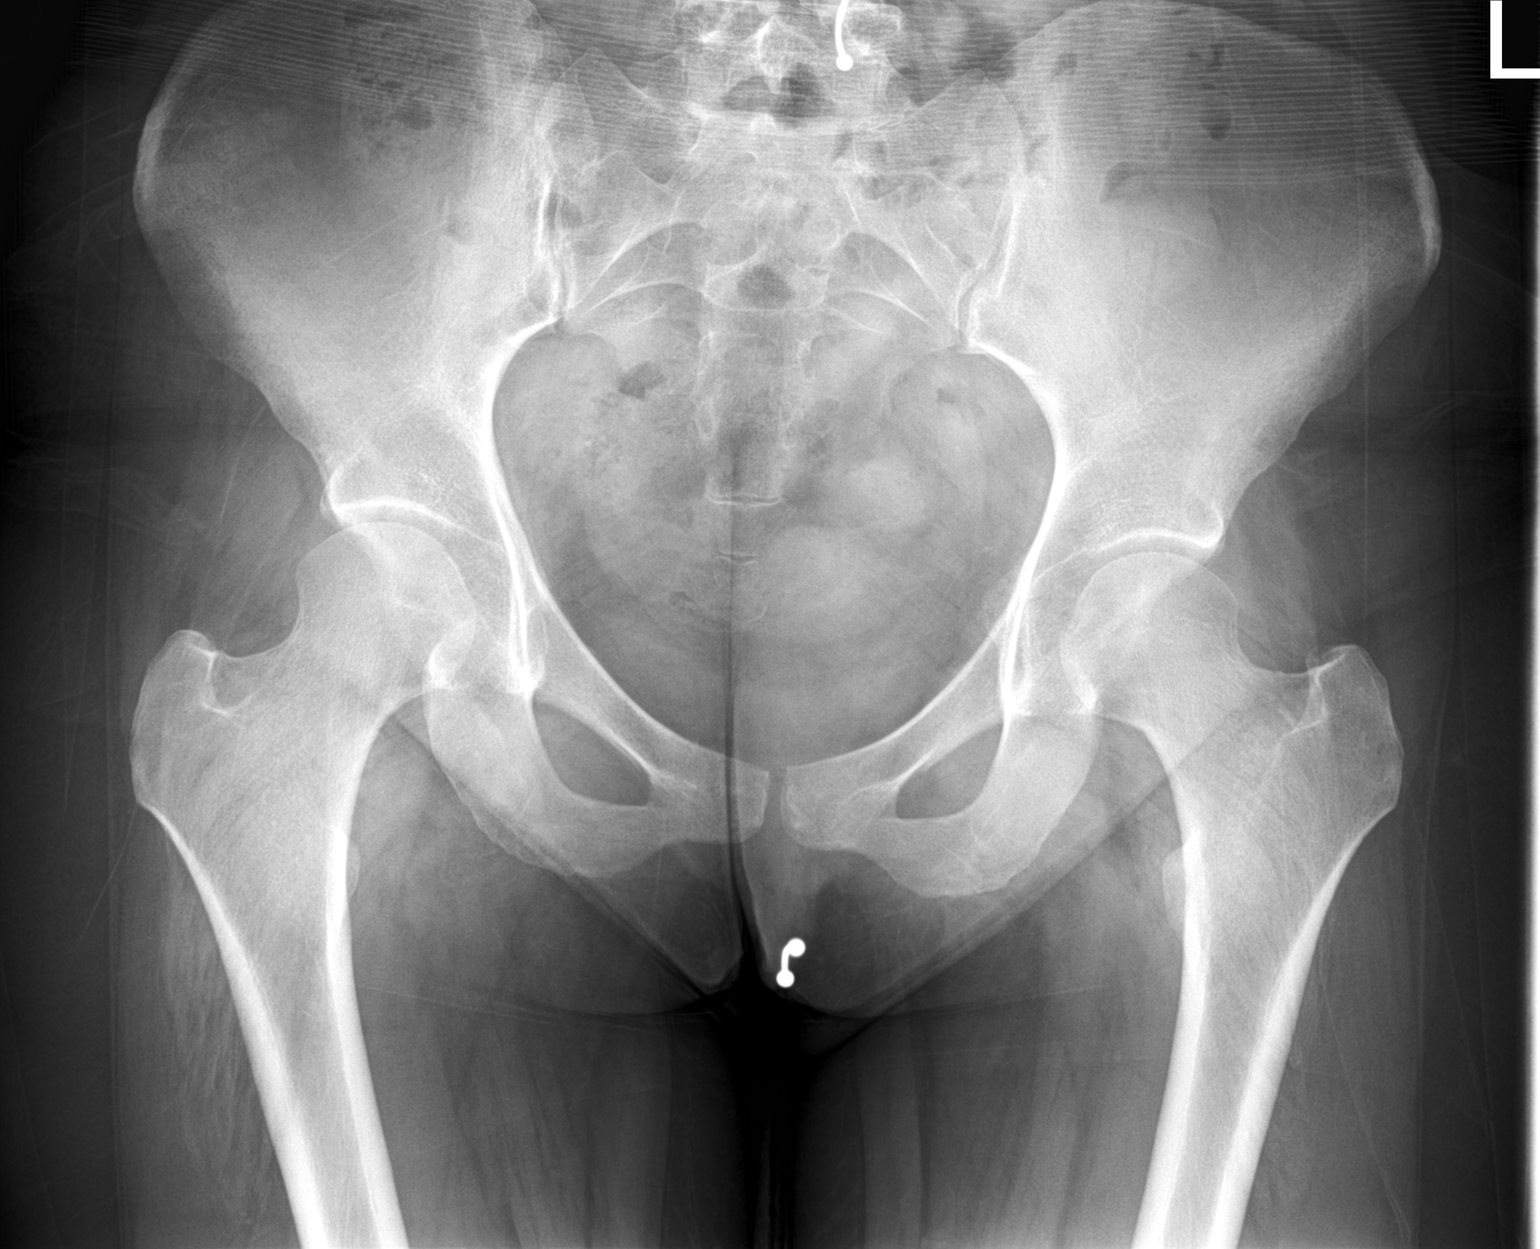

[1 of 1 positions shown; findings below may reference images not displayed]

FINDINGS: The bony pelvis is subjectively adequately mineralized. There is no
acute or healing fracture. There is no lytic nor blastic lesion. The
pubic rami are intact. The observed portions of the sacrum are
normal. The hip joint spaces are reasonably well-maintained. The
proximal femurs appear normal.
IMPRESSION: No acute bony abnormality of the pelvis is observed.

## 2016-11-24 ENCOUNTER — Other Ambulatory Visit: Payer: Self-pay | Admitting: Family

## 2016-11-24 DIAGNOSIS — F411 Generalized anxiety disorder: Secondary | ICD-10-CM

## 2016-11-24 NOTE — Telephone Encounter (Signed)
Refill called to [facility] 

## 2016-12-20 DIAGNOSIS — L709 Acne, unspecified: Secondary | ICD-10-CM | POA: Diagnosis not present

## 2016-12-20 DIAGNOSIS — D229 Melanocytic nevi, unspecified: Secondary | ICD-10-CM | POA: Diagnosis not present

## 2016-12-26 ENCOUNTER — Other Ambulatory Visit: Payer: Self-pay | Admitting: Family

## 2016-12-26 DIAGNOSIS — F411 Generalized anxiety disorder: Secondary | ICD-10-CM

## 2016-12-27 NOTE — Telephone Encounter (Signed)
Xanax refill called to pharmacy. 

## 2017-01-27 ENCOUNTER — Other Ambulatory Visit: Payer: Self-pay | Admitting: Family

## 2017-01-27 DIAGNOSIS — F411 Generalized anxiety disorder: Secondary | ICD-10-CM

## 2017-01-27 NOTE — Telephone Encounter (Signed)
Phoned in.

## 2017-03-10 ENCOUNTER — Other Ambulatory Visit: Payer: Self-pay | Admitting: Family

## 2017-03-10 DIAGNOSIS — F411 Generalized anxiety disorder: Secondary | ICD-10-CM

## 2017-03-10 MED ORDER — ALPRAZOLAM 0.25 MG PO TABS: ORAL_TABLET | ORAL | 0 refills | Status: DC

## 2017-03-15 ENCOUNTER — Other Ambulatory Visit: Payer: Self-pay

## 2017-04-20 ENCOUNTER — Other Ambulatory Visit: Payer: Self-pay | Admitting: Family

## 2017-04-20 DIAGNOSIS — F411 Generalized anxiety disorder: Secondary | ICD-10-CM

## 2018-07-05 ENCOUNTER — Other Ambulatory Visit: Payer: Self-pay | Admitting: Family

## 2018-07-05 DIAGNOSIS — F411 Generalized anxiety disorder: Secondary | ICD-10-CM

## 2018-07-09 ENCOUNTER — Ambulatory Visit: Payer: BLUE CROSS/BLUE SHIELD | Admitting: Family

## 2018-07-12 ENCOUNTER — Telehealth: Payer: Self-pay

## 2018-07-12 ENCOUNTER — Encounter: Payer: Self-pay | Admitting: Family

## 2018-07-12 ENCOUNTER — Ambulatory Visit (INDEPENDENT_AMBULATORY_CARE_PROVIDER_SITE_OTHER): Payer: 59 | Admitting: Family

## 2018-07-12 ENCOUNTER — Other Ambulatory Visit: Payer: Self-pay

## 2018-07-12 DIAGNOSIS — J301 Allergic rhinitis due to pollen: Secondary | ICD-10-CM | POA: Diagnosis not present

## 2018-07-12 DIAGNOSIS — F411 Generalized anxiety disorder: Secondary | ICD-10-CM

## 2018-07-12 DIAGNOSIS — F1320 Sedative, hypnotic or anxiolytic dependence, uncomplicated: Secondary | ICD-10-CM | POA: Diagnosis not present

## 2018-07-12 MED ORDER — FLUTICASONE PROPIONATE 50 MCG/ACT NA SUSP: 2.0000 | Freq: Every day | NASAL | 6 refills | Status: DC

## 2018-07-12 MED ORDER — ALPRAZOLAM 0.25 MG PO TABS
ORAL_TABLET | ORAL | 0 refills | Status: DC
Start: 2018-07-12 — End: 2018-09-18

## 2018-07-12 MED ORDER — CETIRIZINE HCL 10 MG PO TABS: 10.0000 mg | ORAL_TABLET | Freq: Every day | ORAL | 3 refills | Status: DC

## 2018-07-12 MED ORDER — MOMETASONE FUROATE 50 MCG/ACT NA SUSP: 2.0000 | Freq: Every day | NASAL | 12 refills | Status: DC

## 2018-07-12 NOTE — Progress Notes (Signed)
   Virtual Visit via telephone Note  I connected with Gabrielle Weber on 07/12/18 at 12:11 pm  by telephone and verified that I am speaking with the correct person using two identifiers. Gabrielle Weber is currently located at work and no one is currently with her during visit. The provider, Jannifer Rodney, FNP is located in their office at time of visit.  I discussed the limitations, risks, security and privacy concerns of performing an evaluation and management service by telephone and the availability of in person appointments. I also discussed with the patient that there may be a patient responsible charge related to this service. The patient expressed understanding and agreed to proceed.   History and Present Illness:   Pt calls the office today for chronic follow up.  Anxiety  Presents for follow-up visit. Symptoms include decreased concentration, depressed mood, excessive worry, irritability, nervous/anxious behavior, panic and restlessness. Symptoms occur occasionally. The severity of symptoms is moderate. The quality of sleep is good.    Allergic Rhinitis  PT taking zyrtec daily.    Review of Systems  Constitutional: Positive for irritability.  Psychiatric/Behavioral: Positive for decreased concentration. The patient is nervous/anxious.   All other systems reviewed and are negative.      Observations/Objective: No SOB or distress   Assessment and Plan: 1. GAD (generalized anxiety disorder) Discussed lexapro. Does not wish to start at this time. Will refill xanax. Has not had medication since 02/2018. Stress management discussed - ALPRAZolam (XANAX) 0.25 MG tablet; TAKE 1 TABLET TWICE DAILY AS NEEDED FOR ANXIETY  Dispense: 20 tablet; Refill: 0  2. Allergic rhinitis due to pollen, unspecified seasonality Avoid allergens  - mometasone (NASONEX) 50 MCG/ACT nasal spray; Place 2 sprays into the nose daily.  Dispense: 17 g; Refill: 12 - cetirizine (ZYRTEC) 10 MG tablet; Take 1 tablet  (10 mg total) by mouth daily.  Dispense: 90 tablet; Refill: 3  3. Benzodiazepine dependence (HCC)     I discussed the assessment and treatment plan with the patient. The patient was provided an opportunity to ask questions and all were answered. The patient agreed with the plan and demonstrated an understanding of the instructions.   The patient was advised to call back or seek an in-person evaluation if the symptoms worsen or if the condition fails to improve as anticipated.  The above assessment and management plan was discussed with the patient. The patient verbalized understanding of and has agreed to the management plan. Patient is aware to call the clinic if symptoms persist or worsen. Patient is aware when to return to the clinic for a follow-up visit. Patient educated on when it is appropriate to go to the emergency department.    Call ended 12:25 pm, I provided 14 minutes of non-face-to-face time during this encounter.    Jannifer Rodney, FNP

## 2018-07-12 NOTE — Telephone Encounter (Signed)
FYI, Mometasone Furoate is $75.00, pharmacy asking if you could prescribe perhaps Fluticasone instead.

## 2018-09-18 ENCOUNTER — Other Ambulatory Visit: Payer: Self-pay | Admitting: Family

## 2018-09-18 DIAGNOSIS — F411 Generalized anxiety disorder: Secondary | ICD-10-CM

## 2018-10-22 ENCOUNTER — Ambulatory Visit (INDEPENDENT_AMBULATORY_CARE_PROVIDER_SITE_OTHER): Payer: 59 | Admitting: Family Medicine

## 2018-10-22 ENCOUNTER — Other Ambulatory Visit: Payer: Self-pay

## 2018-10-22 ENCOUNTER — Other Ambulatory Visit: Payer: Self-pay | Admitting: Family

## 2018-10-22 ENCOUNTER — Encounter: Payer: Self-pay | Admitting: Family Medicine

## 2018-10-22 DIAGNOSIS — J0100 Acute maxillary sinusitis, unspecified: Secondary | ICD-10-CM

## 2018-10-22 DIAGNOSIS — F411 Generalized anxiety disorder: Secondary | ICD-10-CM

## 2018-10-22 MED ORDER — DM-GUAIFENESIN ER 30-600 MG PO TB12: 1.0000 | ORAL_TABLET | Freq: Two times a day (BID) | ORAL | 0 refills | Status: AC

## 2018-10-22 MED ORDER — FLUTICASONE PROPIONATE 50 MCG/ACT NA SUSP: 2.0000 | Freq: Every day | NASAL | 6 refills | Status: DC

## 2018-10-22 NOTE — Progress Notes (Signed)
Virtual Visit via telephone Note Due to COVID-19 pandemic this visit was conducted virtually. This visit type was conducted due to national recommendations for restrictions regarding the COVID-19 Pandemic (e.g. social distancing, sheltering in place) in an effort to limit this patient's exposure and mitigate transmission in our community. All issues noted in this document were discussed and addressed.  A physical exam was not performed with this format.   I connected with Gabrielle Weber on 10/22/18 at 1530 by telephone and verified that I am speaking with the correct person using two identifiers. Gabrielle Weber is currently located at home and family is currently with them during visit. The provider, Kari BaarsMichelle Olita Takeshita, FNP is located in their office at time of visit.  I discussed the limitations, risks, security and privacy concerns of performing an evaluation and management service by telephone and the availability of in person appointments. I also discussed with the patient that there may be a patient responsible charge related to this service. The patient expressed understanding and agreed to proceed.  Subjective:  Patient ID: Gabrielle Weber, female    DOB: April 24, 1990, 28 y.o.   MRN: [id number]  Chief Complaint:  Sinus Problem   HPI: Gabrielle Weber is a 28 y.o. female presenting on 10/22/2018 for Sinus Problem   Pt reports one day of sinus pressure. States she has pressure below her eyes. She states this was present when she woke up this morning. No cough, fever, sore throat, otalgia, lymphadenopathy, chills, fatigue, or headache. No rhinorrhea or postnasal drainage. States she has been taking Zyrtec daily. Has not been using Flonase as prescribed.   Sinus Problem This is a new problem. The current episode started today. The problem is unchanged. There has been no fever. Her pain is at a severity of 3/10. The pain is mild. Associated symptoms include sinus pressure. Pertinent negatives include no chills,  congestion, coughing, diaphoresis, ear pain, headaches, hoarse voice, neck pain, shortness of breath, sneezing, sore throat or swollen glands. Past treatments include nothing.     Relevant past medical, surgical, family, and social history reviewed and updated as indicated.  Allergies and medications reviewed and updated.   History reviewed. No pertinent past medical history.  History reviewed. No pertinent surgical history.  Social History   Socioeconomic History  . Marital status: Single    Spouse name: Not on file  . Number of children: Not on file  . Years of education: Not on file  . Highest education level: Not on file  Occupational History  . Not on file  Social Needs  . Financial resource strain: Not on file  . Food insecurity    Worry: Not on file    Inability: Not on file  . Transportation needs    Medical: Not on file    Non-medical: Not on file  Tobacco Use  . Smoking status: Former Smoker    Packs/day: 0.50    Types: Cigarettes    Quit date: 01/20/2015    Years since quitting: 3.7  . Smokeless tobacco: Never Used  Substance and Sexual Activity  . Alcohol use: No  . Drug use: No  . Sexual activity: Not on file  Lifestyle  . Physical activity    Days per week: Not on file    Minutes per session: Not on file  . Stress: Not on file  Relationships  . Social Musicianconnections    Talks on phone: Not on file    Gets together: Not on file    Attends  religious service: Not on file    Active member of club or organization: Not on file    Attends meetings of clubs or organizations: Not on file    Relationship status: Not on file  . Intimate partner violence    Fear of current or ex partner: Not on file    Emotionally abused: Not on file    Physically abused: Not on file    Forced sexual activity: Not on file  Other Topics Concern  . Not on file  Social History Narrative  . Not on file    Outpatient Encounter Medications as of 10/22/2018  Medication Sig  .  ALPRAZolam (XANAX) 0.25 MG tablet TAKE 1 TABLET TWICE DAILY AS NEEDED FOR ANXIETY  . cetirizine (ZYRTEC) 10 MG tablet Take 1 tablet (10 mg total) by mouth daily.  Marland Kitchen. dextromethorphan-guaiFENesin (MUCINEX DM) 30-600 MG 12hr tablet Take 1 tablet by mouth 2 (two) times daily for 10 days.  . fluticasone (FLONASE) 50 MCG/ACT nasal spray Place 2 sprays into both nostrils daily.  . [DISCONTINUED] fluticasone (FLONASE) 50 MCG/ACT nasal spray Place 2 sprays into both nostrils daily.   No facility-administered encounter medications on file as of 10/22/2018.     Allergies  Allergen Reactions  . Augmentin [Amoxicillin-Pot Clavulanate]   . Bactrim [Sulfamethoxazole-Trimethoprim]     Review of Systems  Constitutional: Negative for activity change, appetite change, chills, diaphoresis, fatigue, fever and unexpected weight change.  HENT: Positive for sinus pressure. Negative for congestion, ear pain, hoarse voice, postnasal drip, rhinorrhea, sinus pain, sneezing, sore throat, tinnitus, trouble swallowing and voice change.   Eyes: Negative for photophobia and visual disturbance.  Respiratory: Negative for cough and shortness of breath.   Cardiovascular: Negative for chest pain and palpitations.  Gastrointestinal: Negative for abdominal pain, diarrhea, nausea and vomiting.  Genitourinary: Negative for decreased urine volume and difficulty urinating.  Musculoskeletal: Negative for arthralgias, myalgias and neck pain.  Neurological: Negative for dizziness, weakness, light-headedness and headaches.  Psychiatric/Behavioral: Negative for confusion.  All other systems reviewed and are negative.        Observations/Objective: No vital signs or physical exam, this was a telephone or virtual health encounter.  Pt alert and oriented, answers all questions appropriately, and able to speak in full sentences.    Assessment and Plan: Gabrielle Weber was seen today for sinus problem.  Diagnoses and all orders for this  visit:  Acute non-recurrent maxillary sinusitis Reported symptoms consistent with viral sinusitis. No fever, chills, fatigue, double sickening, or other symptoms concerning for acute bacterial infection. Pt insistent upon antibiotic therapy. Long discussion about the importance about avoiding the unnecessary use of antibiotics and the reason antibiotics were not indicated. Pt states she always receives a z-pack. Pt aware we will proceed with symptomatic care. Pt aware if symptoms persist or worsen, she can call back to be reevaluated. Pt aware of symptomatic care including increase water intake, continue Zyrtec, Flonase daily, frequent saline nasal sprays, and Mucinex as needed for cough and congestion. Pt aware if symptoms persist past 5-6 days or if she develops a high fever, she needs to be reevaluated. Pt reluctant to agree to treatment plan but does.  -     fluticasone (FLONASE) 50 MCG/ACT nasal spray; Place 2 sprays into both nostrils daily. -     dextromethorphan-guaiFENesin (MUCINEX DM) 30-600 MG 12hr tablet; Take 1 tablet by mouth 2 (two) times daily for 10 days.     Follow Up Instructions: Return if symptoms worsen or fail to  improve.    I discussed the assessment and treatment plan with the patient. The patient was provided an opportunity to ask questions and all were answered. The patient agreed with the plan and demonstrated an understanding of the instructions.   The patient was advised to call back or seek an in-person evaluation if the symptoms worsen or if the condition fails to improve as anticipated.  The above assessment and management plan was discussed with the patient. The patient verbalized understanding of and has agreed to the management plan. Patient is aware to call the clinic if symptoms persist or worsen. Patient is aware when to return to the clinic for a follow-up visit. Patient educated on when it is appropriate to go to the emergency department.    I provided 15  minutes of non-face-to-face time during this encounter. The call started at 1530. The call ended at 1540. The other time was used for coordination of care.    Monia Pouch, FNP-C [facility] [address] [place], [place] [address] [phone] 10/22/18

## 2018-12-19 ENCOUNTER — Other Ambulatory Visit: Payer: Self-pay | Admitting: Family

## 2018-12-19 DIAGNOSIS — F411 Generalized anxiety disorder: Secondary | ICD-10-CM

## 2019-01-03 ENCOUNTER — Ambulatory Visit (INDEPENDENT_AMBULATORY_CARE_PROVIDER_SITE_OTHER): Payer: 59 | Admitting: Family

## 2019-01-03 ENCOUNTER — Encounter: Payer: Self-pay | Admitting: Family

## 2019-01-03 DIAGNOSIS — J029 Acute pharyngitis, unspecified: Secondary | ICD-10-CM | POA: Diagnosis not present

## 2019-01-03 MED ORDER — AZITHROMYCIN 250 MG PO TABS: ORAL_TABLET | ORAL | 0 refills | Status: DC

## 2019-01-03 NOTE — Progress Notes (Signed)
   Virtual Visit via telephone Note Due to COVID-19 pandemic this visit was conducted virtually. This visit type was conducted due to national recommendations for restrictions regarding the COVID-19 Pandemic (e.g. social distancing, sheltering in place) in an effort to limit this patient's exposure and mitigate transmission in our community. All issues noted in this document were discussed and addressed.  A physical exam was not performed with this format.  I connected with Gabrielle Weber on 01/03/19 at 9:40 AM by telephone and verified that I am speaking with the correct person using two identifiers. Gabrielle Weber is currently located at work and no one is currently with her during visit. The provider, Evelina Dun, FNP is located in their office at time of visit.  I discussed the limitations, risks, security and privacy concerns of performing an evaluation and management service by telephone and the availability of in person appointments. I also discussed with the patient that there may be a patient responsible charge related to this service. The patient expressed understanding and agreed to proceed.   History and Present Illness:  Sore Throat  This is a new problem. The current episode started in the past 7 days. The problem has been gradually worsening. There has been no fever. The pain is at a severity of 6/10. The pain is mild. Associated symptoms include congestion, ear pain, swollen glands and trouble swallowing. Pertinent negatives include no coughing, ear discharge or headaches. Associated symptoms comments: White patches on left tonsil. She has tried NSAIDs (mucinex) for the symptoms. The treatment provided mild relief.      Review of Systems  HENT: Positive for congestion, ear pain and trouble swallowing. Negative for ear discharge.   Respiratory: Negative for cough.   Neurological: Negative for headaches.     Observations/Objective: No SOB or distress noted   Assessment and Plan:  1. Acute pharyngitis, unspecified etiology - Take meds as prescribed - Use a cool mist humidifier  -Use saline nose sprays frequently -Force fluids -For any cough or congestion  Use plain Mucinex- regular strength or max strength is fine -For fever or aces or pains- take tylenol or ibuprofen. -Throat lozenges if help -New toothbrush in 3 days Call office today if symptoms worsen or do not improve  - azithromycin (ZITHROMAX) 250 MG tablet; Take 500 mg once, then 250 mg for four days  Dispense: 6 tablet; Refill: 0     I discussed the assessment and treatment plan with the patient. The patient was provided an opportunity to ask questions and all were answered. The patient agreed with the plan and demonstrated an understanding of the instructions.   The patient was advised to call back or seek an in-person evaluation if the symptoms worsen or if the condition fails to improve as anticipated.  The above assessment and management plan was discussed with the patient. The patient verbalized understanding of and has agreed to the management plan. Patient is aware to call the clinic if symptoms persist or worsen. Patient is aware when to return to the clinic for a follow-up visit. Patient educated on when it is appropriate to go to the emergency department.   Time call ended:  9:50 AM  I provided  10 minutes of non-face-to-face time during this encounter.    Evelina Dun, FNP

## 2019-01-11 ENCOUNTER — Ambulatory Visit (INDEPENDENT_AMBULATORY_CARE_PROVIDER_SITE_OTHER): Payer: 59 | Admitting: Family

## 2019-01-11 ENCOUNTER — Encounter: Payer: Self-pay | Admitting: Family

## 2019-01-11 DIAGNOSIS — K219 Gastro-esophageal reflux disease without esophagitis: Secondary | ICD-10-CM | POA: Diagnosis not present

## 2019-01-11 DIAGNOSIS — F1320 Sedative, hypnotic or anxiolytic dependence, uncomplicated: Secondary | ICD-10-CM | POA: Diagnosis not present

## 2019-01-11 DIAGNOSIS — F411 Generalized anxiety disorder: Secondary | ICD-10-CM | POA: Diagnosis not present

## 2019-01-11 MED ORDER — ALPRAZOLAM 0.25 MG PO TABS: 0.2500 mg | ORAL_TABLET | Freq: Every evening | ORAL | 1 refills | Status: DC | PRN

## 2019-01-11 MED ORDER — OMEPRAZOLE 20 MG PO CPDR: 20.0000 mg | DELAYED_RELEASE_CAPSULE | Freq: Every day | ORAL | 3 refills | Status: DC

## 2019-01-11 NOTE — Progress Notes (Signed)
Virtual Visit via telephone Note Due to COVID-19 pandemic this visit was conducted virtually. This visit type was conducted due to national recommendations for restrictions regarding the COVID-19 Pandemic (e.g. social distancing, sheltering in place) in an effort to limit this patient's exposure and mitigate transmission in our community. All issues noted in this document were discussed and addressed.  A physical exam was not performed with this format.  Attempted to call patient at 3:00 pm with no answer, VM full and unable to leave message.   I connected with Gabrielle Weber on 01/11/19 at 3:28 pm pm by telephone and verified that I am speaking with the correct person using two identifiers. Gabrielle Weber is currently located at work and no one is currently with her during visit. The provider, Jannifer Rodney, FNP is located in their office at time of visit.  I discussed the limitations, risks, security and privacy concerns of performing an evaluation and management service by telephone and the availability of in person appointments. I also discussed with the patient that there may be a patient responsible charge related to this service. The patient expressed understanding and agreed to proceed.   History and Present Illness:  Anxiety Presents for follow-up visit. Symptoms include decreased concentration, depressed mood, excessive worry, irritability, malaise, nervous/anxious behavior and restlessness. Symptoms occur occasionally. The severity of symptoms is moderate. The quality of sleep is good.    Gastroesophageal Reflux She complains of belching and heartburn. This is a chronic problem. The current episode started more than 1 year ago. The problem occurs occasionally. The problem has been waxing and waning. The symptoms are aggravated by certain foods. She has tried a PPI for the symptoms. The treatment provided mild relief.      Review of Systems  Constitutional: Positive for irritability.   Gastrointestinal: Positive for heartburn.  Psychiatric/Behavioral: Positive for decreased concentration. The patient is nervous/anxious.      Observations/Objective: No SOB or distress noted  Assessment and Plan: 1. GAD (generalized anxiety disorder) Pt will come in the next month to sign controlled agreement  Stress management discussed Only take xanax as needed  - omeprazole (PRILOSEC) 20 MG capsule; Take 1 capsule (20 mg total) by mouth daily.  Dispense: 90 capsule; Refill: 3 - ALPRAZolam (XANAX) 0.25 MG tablet; Take 1 tablet (0.25 mg total) by mouth at bedtime as needed for anxiety.  Dispense: 20 tablet; Refill: 1  2. Benzodiazepine dependence (HCC)  3. Gastroesophageal reflux disease, unspecified whether esophagitis present -Diet discussed- Avoid fried, spicy, citrus foods, caffeine and alcohol -Do not eat 2-3 hours before bedtime -Encouraged small frequent meals -Avoid NSAID's - omeprazole (PRILOSEC) 20 MG capsule; Take 1 capsule (20 mg total) by mouth daily.  Dispense: 90 capsule; Refill: 3     I discussed the assessment and treatment plan with the patient. The patient was provided an opportunity to ask questions and all were answered. The patient agreed with the plan and demonstrated an understanding of the instructions.   The patient was advised to call back or seek an in-person evaluation if the symptoms worsen or if the condition fails to improve as anticipated.  The above assessment and management plan was discussed with the patient. The patient verbalized understanding of and has agreed to the management plan. Patient is aware to call the clinic if symptoms persist or worsen. Patient is aware when to return to the clinic for a follow-up visit. Patient educated on when it is appropriate to go to the emergency department.  Time call ended: 3:42 pm    I provided 14 minutes of non-face-to-face time during this encounter.    Gabrielle Dun, FNP

## 2019-06-17 ENCOUNTER — Encounter: Payer: Self-pay | Admitting: Nurse Practitioner

## 2019-06-17 ENCOUNTER — Ambulatory Visit (INDEPENDENT_AMBULATORY_CARE_PROVIDER_SITE_OTHER): Payer: BC Managed Care – PPO | Admitting: Nurse Practitioner

## 2019-06-17 DIAGNOSIS — J0100 Acute maxillary sinusitis, unspecified: Secondary | ICD-10-CM | POA: Diagnosis not present

## 2019-06-17 MED ORDER — AZITHROMYCIN 250 MG PO TABS: ORAL_TABLET | ORAL | 0 refills | Status: DC

## 2019-06-17 NOTE — Progress Notes (Signed)
Virtual Visit via telephone Note Due to COVID-19 pandemic this visit was conducted virtually. This visit type was conducted due to national recommendations for restrictions regarding the COVID-19 Pandemic (e.g. social distancing, sheltering in place) in an effort to limit this patient's exposure and mitigate transmission in our community. All issues noted in this document were discussed and addressed.  A physical exam was not performed with this format.  I connected with Gabrielle Weber on 06/17/19 at 9:05 by telephone and verified that I am speaking with the correct person using two identifiers. Gabrielle Weber is currently located at work and no one is currently with  her during visit. The provider, Mary-Margaret Daphine Deutscher, FNP is located in their office at time of visit.  I discussed the limitations, risks, security and privacy concerns of performing an evaluation and management service by telephone and the availability of in person appointments. I also discussed with the patient that there may be a patient responsible charge related to this service. The patient expressed understanding and agreed to proceed.   History and Present Illness:   Chief Complaint: Sinusitis   HPI Patient calls in today for video visit c/o nasal congestion, and cough that started 5 days ago. She denies.  Cough has gotten sorse and become productive and started out clear but is now is green.   Review of Systems  Constitutional: Negative for chills and fever.  HENT: Positive for congestion, ear pain and sinus pain. Negative for sore throat.   Respiratory: Positive for cough (productive an dis now green).   Musculoskeletal: Negative.   Neurological: Positive for headaches.  Psychiatric/Behavioral: Negative.   All other systems reviewed and are negative.    Observations/Objective: Alert and oriented- answers all questions appropriately No distress Voice is froggy Wet cough noted  Assessment and Plan: Gabrielle Weber  in today with chief complaint of Sinusitis   1. Acute non-recurrent maxillary sinusitis 1. Take meds as prescribed 2. Use a cool mist humidifier especially during the winter months and when heat has been humid. 3. Use saline nose sprays frequently 4. Saline irrigations of the nose can be very helpful if done frequently.  * 4X daily for 1 week*  * Use of a nettie pot can be helpful with this. Follow directions with this* 5. Drink plenty of fluids 6. Keep thermostat turn down low 7.For any cough or congestion  Use plain Mucinex- regular strength or max strength is fine   * Children- consult with Pharmacist for dosing 8. For fever or aces or pains- take tylenol or ibuprofen appropriate for age and weight.  * for fevers greater than 101 orally you may alternate ibuprofen and tylenol every  3 hours.    - azithromycin (ZITHROMAX Z-PAK) 250 MG tablet; As directed  Dispense: 6 tablet; Refill: 0     Follow Up Instructions: prn    I discussed the assessment and treatment plan with the patient. The patient was provided an opportunity to ask questions and all were answered. The patient agreed with the plan and demonstrated an understanding of the instructions.   The patient was advised to call back or seek an in-person evaluation if the symptoms worsen or if the condition fails to improve as anticipated.  The above assessment and management plan was discussed with the patient. The patient verbalized understanding of and has agreed to the management plan. Patient is aware to call the clinic if symptoms persist or worsen. Patient is aware when to return to the clinic for a  follow-up visit. Patient educated on when it is appropriate to go to the emergency department.   Time call ended:  9:15  I provided 10 minutes of non-face-to-face time during this encounter.    Mary-Margaret Hassell Done, FNP

## 2019-08-26 ENCOUNTER — Other Ambulatory Visit: Payer: Self-pay | Admitting: Family

## 2019-08-26 DIAGNOSIS — J301 Allergic rhinitis due to pollen: Secondary | ICD-10-CM

## 2019-08-26 DIAGNOSIS — F411 Generalized anxiety disorder: Secondary | ICD-10-CM

## 2020-01-13 ENCOUNTER — Other Ambulatory Visit: Payer: Self-pay | Admitting: Family

## 2020-01-13 DIAGNOSIS — J301 Allergic rhinitis due to pollen: Secondary | ICD-10-CM

## 2020-01-13 DIAGNOSIS — K219 Gastro-esophageal reflux disease without esophagitis: Secondary | ICD-10-CM

## 2020-01-13 DIAGNOSIS — F411 Generalized anxiety disorder: Secondary | ICD-10-CM

## 2020-05-30 ENCOUNTER — Other Ambulatory Visit: Payer: Self-pay | Admitting: Family

## 2020-05-30 DIAGNOSIS — F411 Generalized anxiety disorder: Secondary | ICD-10-CM

## 2020-05-30 DIAGNOSIS — J301 Allergic rhinitis due to pollen: Secondary | ICD-10-CM

## 2020-05-30 DIAGNOSIS — K219 Gastro-esophageal reflux disease without esophagitis: Secondary | ICD-10-CM

## 2020-06-02 ENCOUNTER — Telehealth: Payer: Self-pay

## 2020-06-02 DIAGNOSIS — K219 Gastro-esophageal reflux disease without esophagitis: Secondary | ICD-10-CM

## 2020-06-02 DIAGNOSIS — J301 Allergic rhinitis due to pollen: Secondary | ICD-10-CM

## 2020-06-02 DIAGNOSIS — F411 Generalized anxiety disorder: Secondary | ICD-10-CM

## 2020-06-02 MED ORDER — CETIRIZINE HCL 10 MG PO TABS: 10.0000 mg | ORAL_TABLET | Freq: Every day | ORAL | 0 refills | Status: DC

## 2020-06-02 MED ORDER — OMEPRAZOLE 20 MG PO CPDR: 20.0000 mg | DELAYED_RELEASE_CAPSULE | Freq: Every day | ORAL | 0 refills | Status: DC

## 2020-06-02 NOTE — Telephone Encounter (Signed)
Pt aware 30 day refill sent to pharmacy

## 2020-06-02 NOTE — Telephone Encounter (Signed)
  Prescription Request  06/02/2020  What is the name of the medication or equipment? Pt made appt for med refill at end of month. She needs her allergy and heart burn meds called in  Have you contacted your pharmacy to request a refill? (if applicable) yes  Which pharmacy would you like this sent to? [facility]   Patient notified that their request is being sent to the clinical staff for review and that they should receive a response within 2 business days.

## 2020-06-11 ENCOUNTER — Encounter: Payer: Self-pay | Admitting: Family

## 2020-06-11 ENCOUNTER — Telehealth: Payer: Self-pay

## 2020-06-11 ENCOUNTER — Ambulatory Visit: Payer: Managed Care, Other (non HMO) | Admitting: Family

## 2020-06-11 DIAGNOSIS — J301 Allergic rhinitis due to pollen: Secondary | ICD-10-CM

## 2020-06-11 DIAGNOSIS — K2100 Gastro-esophageal reflux disease with esophagitis, without bleeding: Secondary | ICD-10-CM

## 2020-06-11 DIAGNOSIS — F411 Generalized anxiety disorder: Secondary | ICD-10-CM | POA: Diagnosis not present

## 2020-06-11 DIAGNOSIS — K219 Gastro-esophageal reflux disease without esophagitis: Secondary | ICD-10-CM

## 2020-06-11 MED ORDER — ALPRAZOLAM 0.25 MG PO TABS: 0.2500 mg | ORAL_TABLET | Freq: Every evening | ORAL | 0 refills | Status: DC | PRN

## 2020-06-11 MED ORDER — OMEPRAZOLE 20 MG PO CPDR: 20.0000 mg | DELAYED_RELEASE_CAPSULE | Freq: Every day | ORAL | 1 refills | Status: DC

## 2020-06-11 MED ORDER — FLUTICASONE PROPIONATE 50 MCG/ACT NA SUSP: 2.0000 | Freq: Every day | NASAL | 6 refills | Status: DC

## 2020-06-11 MED ORDER — CETIRIZINE HCL 10 MG PO TABS: 10.0000 mg | ORAL_TABLET | Freq: Every day | ORAL | 0 refills | Status: DC

## 2020-06-11 NOTE — Progress Notes (Signed)
Virtual Visit via telephone Note Due to COVID-19 pandemic this visit was conducted virtually. This visit type was conducted due to national recommendations for restrictions regarding the COVID-19 Pandemic (e.g. social distancing, sheltering in place) in an effort to limit this patient's exposure and mitigate transmission in our community. All issues noted in this document were discussed and addressed.  A physical exam was not performed with this format.  I connected with Gabrielle Weber on 06/11/20 at 3:47 pm  by telephone and verified that I am speaking with the correct person using two identifiers. Gabrielle Weber is currently located at home and no one  is currently with her during visit. The provider, Jannifer Rodney, FNP is located in their office at time of visit.  I discussed the limitations, risks, security and privacy concerns of performing an evaluation and management service by telephone and the availability of in person appointments. I also discussed with the patient that there may be a patient responsible charge related to this service. The patient expressed understanding and agreed to proceed.    History and Present Illness:  PT calls the office today for chronic follow up. She is currently taking zyrtec and flonase as needed for allergies.   Gastroesophageal Reflux She complains of belching and heartburn. This is a chronic problem. The current episode started more than 1 year ago. The problem occurs occasionally. The problem has been waxing and waning. She has tried a PPI for the symptoms. The treatment provided moderate relief.  Anxiety Presents for follow-up visit. Symptoms include depressed mood, excessive worry, irritability, nervous/anxious behavior and restlessness. Symptoms occur occasionally. The severity of symptoms is moderate. The quality of sleep is good.        Review of Systems  Constitutional: Positive for irritability.  Gastrointestinal: Positive for heartburn.   Psychiatric/Behavioral: The patient is nervous/anxious.   All other systems reviewed and are negative.    Observations/Objective: No SOB or distress noted   Assessment and Plan: 1. GAD (generalized anxiety disorder) - omeprazole (PRILOSEC) 20 MG capsule; Take 1 capsule (20 mg total) by mouth daily.  Dispense: 90 capsule; Refill: 1 - ALPRAZolam (XANAX) 0.25 MG tablet; Take 1 tablet (0.25 mg total) by mouth at bedtime as needed for anxiety.  Dispense: 20 tablet; Refill: 0  2. Gastroesophageal reflux disease with esophagitis, unspecified whether hemorrhage  3. Gastroesophageal reflux disease, unspecified whether esophagitis present - omeprazole (PRILOSEC) 20 MG capsule; Take 1 capsule (20 mg total) by mouth daily.  Dispense: 90 capsule; Refill: 1  4. Allergic rhinitis due to pollen, unspecified seasonality - cetirizine (ZYRTEC) 10 MG tablet; Take 1 tablet (10 mg total) by mouth daily.  Dispense: 30 tablet; Refill: 0 - fluticasone (FLONASE) 50 MCG/ACT nasal spray; Place 2 sprays into both nostrils daily.  Dispense: 16 g; Refill: 6  Will refill medications today She will come in 1 month for contract and pap We will discuss adding buspar as needed at that time Stress management discussed     I discussed the assessment and treatment plan with the patient. The patient was provided an opportunity to ask questions and all were answered. The patient agreed with the plan and demonstrated an understanding of the instructions.   The patient was advised to call back or seek an in-person evaluation if the symptoms worsen or if the condition fails to improve as anticipated.  The above assessment and management plan was discussed with the patient. The patient verbalized understanding of and has agreed to the management plan. Patient  is aware to call the clinic if symptoms persist or worsen. Patient is aware when to return to the clinic for a follow-up visit. Patient educated on when it is appropriate  to go to the emergency department.   Time call ended:  3:57 pm   I provided 11 minutes of non-face-to-face time during this encounter.    Jannifer Rodney, FNP

## 2020-06-11 NOTE — Addendum Note (Signed)
Addended by: Jannifer Rodney A on: 06/11/2020 04:41 PM   Modules accepted: Orders

## 2020-06-15 ENCOUNTER — Ambulatory Visit: Payer: BC Managed Care – PPO | Admitting: Family

## 2020-07-20 ENCOUNTER — Encounter: Payer: Self-pay | Admitting: Family Medicine

## 2021-09-28 ENCOUNTER — Telehealth (INDEPENDENT_AMBULATORY_CARE_PROVIDER_SITE_OTHER): Payer: 59 | Admitting: Family

## 2021-09-28 ENCOUNTER — Encounter: Payer: Self-pay | Admitting: Family

## 2021-09-28 DIAGNOSIS — K2101 Gastro-esophageal reflux disease with esophagitis, with bleeding: Secondary | ICD-10-CM | POA: Insufficient documentation

## 2021-09-28 DIAGNOSIS — F411 Generalized anxiety disorder: Secondary | ICD-10-CM

## 2021-09-28 NOTE — Progress Notes (Signed)
PT has not been seen in person in 5 years. I can not give controlled mediations without face to face visit. She will make face to face visit and we no charge this visit.   Jannifer Rodney, FNP

## 2021-10-15 ENCOUNTER — Ambulatory Visit (INDEPENDENT_AMBULATORY_CARE_PROVIDER_SITE_OTHER): Payer: 59 | Admitting: Family

## 2021-10-15 ENCOUNTER — Encounter: Payer: Self-pay | Admitting: Family

## 2021-10-15 VITALS — BP 123/77 | HR 127 | Temp 98.7°F | Ht 63.0 in | Wt 159.2 lb

## 2021-10-15 DIAGNOSIS — K219 Gastro-esophageal reflux disease without esophagitis: Secondary | ICD-10-CM | POA: Diagnosis not present

## 2021-10-15 DIAGNOSIS — K2101 Gastro-esophageal reflux disease with esophagitis, with bleeding: Secondary | ICD-10-CM | POA: Diagnosis not present

## 2021-10-15 DIAGNOSIS — F411 Generalized anxiety disorder: Secondary | ICD-10-CM | POA: Diagnosis not present

## 2021-10-15 DIAGNOSIS — Z79899 Other long term (current) drug therapy: Secondary | ICD-10-CM

## 2021-10-15 DIAGNOSIS — J301 Allergic rhinitis due to pollen: Secondary | ICD-10-CM | POA: Diagnosis not present

## 2021-10-15 DIAGNOSIS — J029 Acute pharyngitis, unspecified: Secondary | ICD-10-CM

## 2021-10-15 DIAGNOSIS — F1320 Sedative, hypnotic or anxiolytic dependence, uncomplicated: Secondary | ICD-10-CM

## 2021-10-15 MED ORDER — OMEPRAZOLE 20 MG PO CPDR: 20.0000 mg | DELAYED_RELEASE_CAPSULE | Freq: Every day | ORAL | 1 refills | Status: DC

## 2021-10-15 MED ORDER — ALPRAZOLAM 0.25 MG PO TABS: 0.2500 mg | ORAL_TABLET | Freq: Every evening | ORAL | 2 refills | Status: DC | PRN

## 2021-10-15 MED ORDER — CETIRIZINE HCL 10 MG PO TABS: 10.0000 mg | ORAL_TABLET | Freq: Every day | ORAL | 0 refills | Status: DC

## 2021-10-15 MED ORDER — AZITHROMYCIN 250 MG PO TABS: ORAL_TABLET | ORAL | 0 refills | Status: DC

## 2021-10-15 NOTE — Progress Notes (Signed)
Subjective:    Patient ID: Gabrielle Weber, female    DOB: 1990/07/05, 31 y.o.   MRN: [id number]  Chief Complaint  Patient presents with   Gastroesophageal Reflux   Anxiety   Nevus    Left leg wants checked    Pt presents to the office today for chronic follow up. She currently takes xanax as needed such as before flying or panic attacks. One prescription usually lasts her a year.   Reports she is going on a cruise next week.  Gastroesophageal Reflux She complains of belching and heartburn. This is a chronic problem. The current episode started more than 1 year ago. The problem occurs occasionally. She has tried a PPI for the symptoms. The treatment provided mild relief.  Anxiety Presents for follow-up visit. Symptoms include excessive worry, irritability and nervous/anxious behavior. Symptoms occur occasionally. The severity of symptoms is moderate.    Sore Throat  This is a new problem. The current episode started 1 to 4 weeks ago. The problem has been waxing and waning. There has been no fever. The pain is moderate. Pertinent negatives include no ear pain.      Review of Systems  Constitutional:  Positive for irritability.  HENT:  Negative for ear pain.   Gastrointestinal:  Positive for heartburn.  Psychiatric/Behavioral:  The patient is nervous/anxious.   All other systems reviewed and are negative.      Objective:   Physical Exam Vitals reviewed.  Constitutional:      General: She is not in acute distress.    Appearance: She is well-developed.  HENT:     Head: Normocephalic and atraumatic.     Right Ear: Tympanic membrane normal.     Left Ear: Tympanic membrane normal.  Eyes:     Pupils: Pupils are equal, round, and reactive to light.  Neck:     Thyroid: No thyromegaly.  Cardiovascular:     Rate and Rhythm: Normal rate and regular rhythm.     Heart sounds: Normal heart sounds. No murmur heard. Pulmonary:     Effort: Pulmonary effort is normal. No respiratory  distress.     Breath sounds: Normal breath sounds. No wheezing.  Abdominal:     General: Bowel sounds are normal. There is no distension.     Palpations: Abdomen is soft.     Tenderness: There is no abdominal tenderness.  Musculoskeletal:        General: No tenderness. Normal range of motion.     Cervical back: Normal range of motion and neck supple.  Skin:    General: Skin is warm and dry.  Neurological:     Mental Status: She is alert and oriented to person, place, and time.     Cranial Nerves: No cranial nerve deficit.     Deep Tendon Reflexes: Reflexes are normal and symmetric.  Psychiatric:        Mood and Affect: Mood is anxious.        Behavior: Behavior normal.        Thought Content: Thought content normal.        Judgment: Judgment normal.       BP 123/77   Pulse (!) 127   Temp 98.7 F (37.1 C) (Temporal)   Ht 5\' 3"  (1.6 m)   Wt 159 lb 3.2 oz (72.2 kg)   LMP 09/24/2021   BMI 28.20 kg/m      Assessment & Plan:  Gabrielle Weber comes in today with chief complaint of Gastroesophageal Reflux,  Anxiety, and Nevus (Left leg wants checked )   Diagnosis and orders addressed:  1. Allergic rhinitis due to pollen, unspecified seasonality - cetirizine (ZYRTEC) 10 MG tablet; Take 1 tablet (10 mg total) by mouth daily.  Dispense: 30 tablet; Refill: 0  2. GAD (generalized anxiety disorder) - ALPRAZolam (XANAX) 0.25 MG tablet; Take 1 tablet (0.25 mg total) by mouth at bedtime as needed for anxiety.  Dispense: 20 tablet; Refill: 2 - omeprazole (PRILOSEC) 20 MG capsule; Take 1 capsule (20 mg total) by mouth daily.  Dispense: 90 capsule; Refill: 1 - ToxASSURE Select 13 (MW), Urine  3. Gastroesophageal reflux disease, unspecified whether esophagitis present - omeprazole (PRILOSEC) 20 MG capsule; Take 1 capsule (20 mg total) by mouth daily.  Dispense: 90 capsule; Refill: 1  4. Gastroesophageal reflux disease with esophagitis and hemorrhage  5. Benzodiazepine dependence  (HCC)  6. Controlled substance agreement signed - ToxASSURE Select 13 (MW), Urine  7. Acute pharyngitis, unspecified etiology - azithromycin (ZITHROMAX) 250 MG tablet; Take 500 mg once, then 250 mg for four days  Dispense: 6 tablet; Refill: 0   Labs refused. Patient reviewed in [place] controlled database, no flags noted. Contract and drug screen up dated today.  Health Maintenance reviewed Diet and exercise encouraged  Follow up plan: 3 months    Jannifer Rodney, FNP

## 2021-10-15 NOTE — Patient Instructions (Signed)
Gastroesophageal Reflux Disease, Adult Gastroesophageal reflux (GER) happens when acid from the stomach flows up into the tube that connects the mouth and the stomach (esophagus). Normally, food travels down the esophagus and stays in the stomach to be digested. However, when a person has GER, food and stomach acid sometimes move back up into the esophagus. If this becomes a more serious problem, the person may be diagnosed with a disease called gastroesophageal reflux disease (GERD). GERD occurs when the reflux: Happens often. Causes frequent or severe symptoms. Causes problems such as damage to the esophagus. When stomach acid comes in contact with the esophagus, the acid may cause inflammation in the esophagus. Over time, GERD may create small holes (ulcers) in the lining of the esophagus. What are the causes? This condition is caused by a problem with the muscle between the esophagus and the stomach (lower esophageal sphincter, or LES). Normally, the LES muscle closes after food passes through the esophagus to the stomach. When the LES is weakened or abnormal, it does not close properly, and that allows food and stomach acid to go back up into the esophagus. The LES can be weakened by certain dietary substances, medicines, and medical conditions, including: Tobacco use. Pregnancy. Having a hiatal hernia. Alcohol use. Certain foods and beverages, such as coffee, chocolate, onions, and peppermint. What increases the risk? You are more likely to develop this condition if you: Have an increased body weight. Have a connective tissue disorder. Take NSAIDs, such as ibuprofen. What are the signs or symptoms? Symptoms of this condition include: Heartburn. Difficult or painful swallowing and the feeling of having a lump in the throat. A bitter taste in the mouth. Bad breath and having a large amount of saliva. Having an upset or bloated stomach and belching. Chest pain. Different conditions can  cause chest pain. Make sure you see your health care provider if you experience chest pain. Shortness of breath or wheezing. Ongoing (chronic) cough or a nighttime cough. Wearing away of tooth enamel. Weight loss. How is this diagnosed? This condition may be diagnosed based on a medical history and a physical exam. To determine if you have mild or severe GERD, your health care provider may also monitor how you respond to treatment. You may also have tests, including: A test to examine your stomach and esophagus with a small camera (endoscopy). A test that measures the acidity level in your esophagus. A test that measures how much pressure is on your esophagus. A barium swallow or modified barium swallow test to show the shape, size, and functioning of your esophagus. How is this treated? Treatment for this condition may vary depending on how severe your symptoms are. Your health care provider may recommend: Changes to your diet. Medicine. Surgery. The goal of treatment is to help relieve your symptoms and to prevent complications. Follow these instructions at home: Eating and drinking  Follow a diet as recommended by your health care provider. This may involve avoiding foods and drinks such as: Coffee and tea, with or without caffeine. Drinks that contain alcohol. Energy drinks and sports drinks. Carbonated drinks or sodas. Chocolate and cocoa. Peppermint and mint flavorings. Garlic and onions. Horseradish. Spicy and acidic foods, including peppers, chili powder, curry powder, vinegar, hot sauces, and barbecue sauce. Citrus fruit juices and citrus fruits, such as oranges, lemons, and limes. Tomato-based foods, such as red sauce, chili, salsa, and pizza with red sauce. Fried and fatty foods, such as donuts, french fries, potato chips, and high-fat dressings.   High-fat meats, such as hot dogs and fatty cuts of red and white meats, such as rib eye steak, sausage, ham, and  bacon. High-fat dairy items, such as whole milk, butter, and cream cheese. Eat small, frequent meals instead of large meals. Avoid drinking large amounts of liquid with your meals. Avoid eating meals during the 2-3 hours before bedtime. Avoid lying down right after you eat. Do not exercise right after you eat. Lifestyle  Do not use any products that contain nicotine or tobacco. These products include cigarettes, chewing tobacco, and vaping devices, such as e-cigarettes. If you need help quitting, ask your health care provider. Try to reduce your stress by using methods such as yoga or meditation. If you need help reducing stress, ask your health care provider. If you are overweight, reduce your weight to an amount that is healthy for you. Ask your health care provider for guidance about a safe weight loss goal. General instructions Pay attention to any changes in your symptoms. Take over-the-counter and prescription medicines only as told by your health care provider. Do not take aspirin, ibuprofen, or other NSAIDs unless your health care provider told you to take these medicines. Wear loose-fitting clothing. Do not wear anything tight around your waist that causes pressure on your abdomen. Raise (elevate) the head of your bed about 6 inches (15 cm). You can use a wedge to do this. Avoid bending over if this makes your symptoms worse. Keep all follow-up visits. This is important. Contact a health care provider if: You have: New symptoms. Unexplained weight loss. Difficulty swallowing or it hurts to swallow. Wheezing or a persistent cough. A hoarse voice. Your symptoms do not improve with treatment. Get help right away if: You have sudden pain in your arms, neck, jaw, teeth, or back. You suddenly feel sweaty, dizzy, or light-headed. You have chest pain or shortness of breath. You vomit and the vomit is green, yellow, or black, or it looks like blood or coffee grounds. You faint. You  have stool that is red, bloody, or black. You cannot swallow, drink, or eat. These symptoms may represent a serious problem that is an emergency. Do not wait to see if the symptoms will go away. Get medical help right away. Call your local emergency services (911 in the [place] Do not drive yourself to the hospital. Summary Gastroesophageal reflux happens when acid from the stomach flows up into the esophagus. GERD is a disease in which the reflux happens often, causes frequent or severe symptoms, or causes problems such as damage to the esophagus. Treatment for this condition may vary depending on how severe your symptoms are. Your health care provider may recommend diet and lifestyle changes, medicine, or surgery. Contact a health care provider if you have new or worsening symptoms. Take over-the-counter and prescription medicines only as told by your health care provider. Do not take aspirin, ibuprofen, or other NSAIDs unless your health care provider told you to do so. Keep all follow-up visits as told by your health care provider. This is important. This information is not intended to replace advice given to you by your health care provider. Make sure you discuss any questions you have with your health care provider. Document Revised: 09/16/2019 Document Reviewed: 09/16/2019 Elsevier Patient Education  2023 [employer] Inc[employer]  

## 2021-10-21 LAB — TOXASSURE SELECT 13 (MW), URINE

## 2022-01-13 ENCOUNTER — Other Ambulatory Visit: Payer: Self-pay | Admitting: Family

## 2022-01-13 DIAGNOSIS — J301 Allergic rhinitis due to pollen: Secondary | ICD-10-CM

## 2022-01-13 MED ORDER — CETIRIZINE HCL 10 MG PO TABS: 10.0000 mg | ORAL_TABLET | Freq: Every day | ORAL | 1 refills | Status: DC

## 2022-02-21 ENCOUNTER — Ambulatory Visit: Payer: 59 | Admitting: Family

## 2022-02-21 ENCOUNTER — Encounter: Payer: Self-pay | Admitting: Family

## 2022-02-21 VITALS — BP 136/80 | HR 88 | Temp 98.0°F | Ht 63.0 in | Wt 145.0 lb

## 2022-02-21 DIAGNOSIS — R1013 Epigastric pain: Secondary | ICD-10-CM

## 2022-02-21 DIAGNOSIS — K2101 Gastro-esophageal reflux disease with esophagitis, with bleeding: Secondary | ICD-10-CM

## 2022-02-21 MED ORDER — SUCRALFATE 1 G PO TABS: 1.0000 g | ORAL_TABLET | Freq: Three times a day (TID) | ORAL | 0 refills | Status: DC

## 2022-02-21 MED ORDER — SUCRALFATE 1 GM/10ML PO SUSP: 1.0000 g | Freq: Three times a day (TID) | ORAL | 0 refills | Status: DC

## 2022-02-21 MED ORDER — PANTOPRAZOLE SODIUM 40 MG PO TBEC: 40.0000 mg | DELAYED_RELEASE_TABLET | Freq: Every day | ORAL | 1 refills | Status: DC

## 2022-02-21 NOTE — Progress Notes (Signed)
Subjective:    Patient ID: Gabrielle Weber, female    DOB: 21-Oct-1990, 31 y.o.   MRN: [id number]  Chief Complaint  Patient presents with   Abdominal Pain    Goes on bath sides and sometimes goes to her back comes and goes. Happens a lot at night thinks its her gallbladder.  Been going on since the summer past 5 mths has been bad    wart on left leg     Abdominal Pain This is a new problem. The current episode started more than 1 month ago. The onset quality is gradual. The problem occurs intermittently. The pain is located in the epigastric region. The pain is at a severity of 5/10. The pain is mild. The quality of the pain is aching. The abdominal pain radiates to the periumbilical region. Associated symptoms include belching and nausea. Pertinent negatives include no constipation, dysuria, fever, flatus, frequency, headaches, hematuria or vomiting. The pain is aggravated by being still. The pain is relieved by Nothing. She has tried proton pump inhibitors for the symptoms. The treatment provided mild relief.      Review of Systems  Constitutional:  Negative for fever.  Gastrointestinal:  Positive for abdominal pain and nausea. Negative for constipation, flatus and vomiting.  Genitourinary:  Negative for dysuria, frequency and hematuria.  Neurological:  Negative for headaches.  All other systems reviewed and are negative.      Objective:   Physical Exam Vitals reviewed.  Constitutional:      General: She is not in acute distress.    Appearance: She is well-developed.  HENT:     Head: Normocephalic and atraumatic.     Right Ear: External ear normal.  Eyes:     Pupils: Pupils are equal, round, and reactive to light.  Neck:     Thyroid: No thyromegaly.  Cardiovascular:     Rate and Rhythm: Normal rate and regular rhythm.     Heart sounds: Normal heart sounds. No murmur heard. Pulmonary:     Effort: Pulmonary effort is normal. No respiratory distress.     Breath sounds: Normal  breath sounds. No wheezing.  Abdominal:     General: Bowel sounds are normal. There is no distension.     Palpations: Abdomen is soft.     Tenderness: There is no abdominal tenderness (no tenderness noted on exam).  Musculoskeletal:        General: No tenderness. Normal range of motion.     Cervical back: Normal range of motion and neck supple.  Skin:    General: Skin is warm and dry.  Neurological:     Mental Status: She is alert and oriented to person, place, and time.     Cranial Nerves: No cranial nerve deficit.     Deep Tendon Reflexes: Reflexes are normal and symmetric.  Psychiatric:        Behavior: Behavior normal.        Thought Content: Thought content normal.        Judgment: Judgment normal.      BP 136/80   Pulse 88   Temp 98 F (36.7 C) (Temporal)   Ht _0  (1.6 m)   Wt 145 lb (65.8 kg)   SpO2 100%   BMI 25.69 kg/m       Assessment & Plan:  Soua Lenk comes in today with chief complaint of Abdominal Pain (Goes on bath sides and sometimes goes to her back comes and goes. Happens a lot at night thinks  its her gallbladder.  Been going on since the summer past 5 mths has been bad ) and wart on left leg    Diagnosis and orders addressed:  1. Epigastric pain - CMP14+EGFR - CBC with Differential/Platelet - H. pylori Screen - sucralfate (CARAFATE) 1 GM/10ML suspension; Take 10 mLs (1 g total) by mouth 4 (four) times daily -  with meals and at bedtime.  Dispense: 420 mL; Refill: 0  2. Gastroesophageal reflux disease with esophagitis and hemorrhage - pantoprazole (PROTONIX) 40 MG tablet; Take 1 tablet (40 mg total) by mouth daily.  Dispense: 90 tablet; Refill: 1 - CMP14+EGFR - CBC with Differential/Platelet - H. pylori Screen   Stop Prilosec 20 mg, start Protonix 40 mg  -Diet discussed- Avoid fried, spicy, citrus foods, caffeine and alcohol -Do not eat 2-3 hours before bedtime -Encouraged small frequent meals -Avoid NSAID's -Labs pending  Follow up in  2-4 weeks, if pain continues will do CT scan    Evelina Dun, FNP

## 2022-02-21 NOTE — Patient Instructions (Signed)
Gastroesophageal Reflux Disease, Adult Gastroesophageal reflux (GER) happens when acid from the stomach flows up into the tube that connects the mouth and the stomach (esophagus). Normally, food travels down the esophagus and stays in the stomach to be digested. However, when a person has GER, food and stomach acid sometimes move back up into the esophagus. If this becomes a more serious problem, the person may be diagnosed with a disease called gastroesophageal reflux disease (GERD). GERD occurs when the reflux: Happens often. Causes frequent or severe symptoms. Causes problems such as damage to the esophagus. When stomach acid comes in contact with the esophagus, the acid may cause inflammation in the esophagus. Over time, GERD may create small holes (ulcers) in the lining of the esophagus. What are the causes? This condition is caused by a problem with the muscle between the esophagus and the stomach (lower esophageal sphincter, or LES). Normally, the LES muscle closes after food passes through the esophagus to the stomach. When the LES is weakened or abnormal, it does not close properly, and that allows food and stomach acid to go back up into the esophagus. The LES can be weakened by certain dietary substances, medicines, and medical conditions, including: Tobacco use. Pregnancy. Having a hiatal hernia. Alcohol use. Certain foods and beverages, such as coffee, chocolate, onions, and peppermint. What increases the risk? You are more likely to develop this condition if you: Have an increased body weight. Have a connective tissue disorder. Take NSAIDs, such as ibuprofen. What are the signs or symptoms? Symptoms of this condition include: Heartburn. Difficult or painful swallowing and the feeling of having a lump in the throat. A bitter taste in the mouth. Bad breath and having a large amount of saliva. Having an upset or bloated stomach and belching. Chest pain. Different conditions can  cause chest pain. Make sure you see your health care provider if you experience chest pain. Shortness of breath or wheezing. Ongoing (chronic) cough or a nighttime cough. Wearing away of tooth enamel. Weight loss. How is this diagnosed? This condition may be diagnosed based on a medical history and a physical exam. To determine if you have mild or severe GERD, your health care provider may also monitor how you respond to treatment. You may also have tests, including: A test to examine your stomach and esophagus with a small camera (endoscopy). A test that measures the acidity level in your esophagus. A test that measures how much pressure is on your esophagus. A barium swallow or modified barium swallow test to show the shape, size, and functioning of your esophagus. How is this treated? Treatment for this condition may vary depending on how severe your symptoms are. Your health care provider may recommend: Changes to your diet. Medicine. Surgery. The goal of treatment is to help relieve your symptoms and to prevent complications. Follow these instructions at home: Eating and drinking  Follow a diet as recommended by your health care provider. This may involve avoiding foods and drinks such as: Coffee and tea, with or without caffeine. Drinks that contain alcohol. Energy drinks and sports drinks. Carbonated drinks or sodas. Chocolate and cocoa. Peppermint and mint flavorings. Garlic and onions. Horseradish. Spicy and acidic foods, including peppers, chili powder, curry powder, vinegar, hot sauces, and barbecue sauce. Citrus fruit juices and citrus fruits, such as oranges, lemons, and limes. Tomato-based foods, such as red sauce, chili, salsa, and pizza with red sauce. Fried and fatty foods, such as donuts, french fries, potato chips, and high-fat dressings.   High-fat meats, such as hot dogs and fatty cuts of red and white meats, such as rib eye steak, sausage, ham, and  bacon. High-fat dairy items, such as whole milk, butter, and cream cheese. Eat small, frequent meals instead of large meals. Avoid drinking large amounts of liquid with your meals. Avoid eating meals during the 2-3 hours before bedtime. Avoid lying down right after you eat. Do not exercise right after you eat. Lifestyle  Do not use any products that contain nicotine or tobacco. These products include cigarettes, chewing tobacco, and vaping devices, such as e-cigarettes. If you need help quitting, ask your health care provider. Try to reduce your stress by using methods such as yoga or meditation. If you need help reducing stress, ask your health care provider. If you are overweight, reduce your weight to an amount that is healthy for you. Ask your health care provider for guidance about a safe weight loss goal. General instructions Pay attention to any changes in your symptoms. Take over-the-counter and prescription medicines only as told by your health care provider. Do not take aspirin, ibuprofen, or other NSAIDs unless your health care provider told you to take these medicines. Wear loose-fitting clothing. Do not wear anything tight around your waist that causes pressure on your abdomen. Raise (elevate) the head of your bed about 6 inches (15 cm). You can use a wedge to do this. Avoid bending over if this makes your symptoms worse. Keep all follow-up visits. This is important. Contact a health care provider if: You have: New symptoms. Unexplained weight loss. Difficulty swallowing or it hurts to swallow. Wheezing or a persistent cough. A hoarse voice. Your symptoms do not improve with treatment. Get help right away if: You have sudden pain in your arms, neck, jaw, teeth, or back. You suddenly feel sweaty, dizzy, or light-headed. You have chest pain or shortness of breath. You vomit and the vomit is green, yellow, or black, or it looks like blood or coffee grounds. You faint. You  have stool that is red, bloody, or black. You cannot swallow, drink, or eat. These symptoms may represent a serious problem that is an emergency. Do not wait to see if the symptoms will go away. Get medical help right away. Call your local emergency services (911 in the [place] Do not drive yourself to the hospital. Summary Gastroesophageal reflux happens when acid from the stomach flows up into the esophagus. GERD is a disease in which the reflux happens often, causes frequent or severe symptoms, or causes problems such as damage to the esophagus. Treatment for this condition may vary depending on how severe your symptoms are. Your health care provider may recommend diet and lifestyle changes, medicine, or surgery. Contact a health care provider if you have new or worsening symptoms. Take over-the-counter and prescription medicines only as told by your health care provider. Do not take aspirin, ibuprofen, or other NSAIDs unless your health care provider told you to do so. Keep all follow-up visits as told by your health care provider. This is important. This information is not intended to replace advice given to you by your health care provider. Make sure you discuss any questions you have with your health care provider. Document Revised: 09/16/2019 Document Reviewed: 09/16/2019 Elsevier Patient Education  2023 [employer] Inc[employer]  

## 2022-02-21 NOTE — Addendum Note (Signed)
Addended by: Ignacia Bayley on: 02/21/2022 04:38 PM   Modules accepted: Orders

## 2022-02-22 LAB — CMP14+EGFR
ALT: 12 IU/L (ref 0–32)
AST: 40 IU/L (ref 0–40)
Albumin/Globulin Ratio: 1.9 (ref 1.2–2.2)
Albumin: 4.9 g/dL (ref 4.0–5.0)
Alkaline Phosphatase: 70 IU/L (ref 44–121)
BUN/Creatinine Ratio: 8 — ABNORMAL LOW (ref 9–23)
BUN: 6 mg/dL (ref 6–20)
Bilirubin Total: 0.7 mg/dL (ref 0.0–1.2)
CO2: 21 mmol/L (ref 20–29)
Calcium: 9.4 mg/dL (ref 8.7–10.2)
Chloride: 103 mmol/L (ref 96–106)
Creatinine, Ser: 0.79 mg/dL (ref 0.57–1.00)
Globulin, Total: 2.6 g/dL (ref 1.5–4.5)
Glucose: 105 mg/dL — ABNORMAL HIGH (ref 70–99)
Potassium: 3.7 mmol/L (ref 3.5–5.2)
Sodium: 141 mmol/L (ref 134–144)
Total Protein: 7.5 g/dL (ref 6.0–8.5)
eGFR: 103 mL/min/{1.73_m2} (ref >59)

## 2022-02-22 LAB — CBC WITH DIFFERENTIAL/PLATELET
Basophils Absolute: 0.1 10*3/uL (ref 0.0–0.2)
Basos: 1 %
EOS (ABSOLUTE): 0.4 10*3/uL (ref 0.0–0.4)
Eos: 4 %
Hematocrit: 37.8 % (ref 34.0–46.6)
Hemoglobin: 13.1 g/dL (ref 11.1–15.9)
Immature Grans (Abs): 0 10*3/uL (ref 0.0–0.1)
Immature Granulocytes: 0 %
Lymphocytes Absolute: 3.4 10*3/uL — ABNORMAL HIGH (ref 0.7–3.1)
Lymphs: 34 %
MCH: 31 pg (ref 26.6–33.0)
MCHC: 34.7 g/dL (ref 31.5–35.7)
MCV: 89 fL (ref 79–97)
Monocytes Absolute: 0.5 10*3/uL (ref 0.1–0.9)
Monocytes: 5 %
Neutrophils Absolute: 5.5 10*3/uL (ref 1.4–7.0)
Neutrophils: 56 %
Platelets: 198 10*3/uL (ref 150–450)
RBC: 4.23 x10E6/uL (ref 3.77–5.28)
RDW: 12.2 % (ref 11.7–15.4)
WBC: 9.8 10*3/uL (ref 3.4–10.8)

## 2022-02-25 ENCOUNTER — Telehealth: Payer: Self-pay | Admitting: Family

## 2022-02-25 DIAGNOSIS — K2101 Gastro-esophageal reflux disease with esophagitis, with bleeding: Secondary | ICD-10-CM

## 2022-02-25 MED ORDER — LIDOCAINE VISCOUS HCL 2 % MT SOLN: 5.0000 mL | Freq: Every day | OROMUCOSAL | 0 refills | Status: DC | PRN

## 2022-02-25 NOTE — Telephone Encounter (Signed)
Pharmacy called to clarify dosage of dicyclomine in GI cocktail.   Informed Lido 31ml, Maxlox , dicyclomine 24ml- 10mg /ml  Pt to take one dose today and one dose tomorrow and she how she feels after.

## 2022-02-25 NOTE — Telephone Encounter (Signed)
Called and spoke with patient still having the same pain and it is not getting any better with the sucralfate. PCP is off covering provider

## 2022-02-25 NOTE — Telephone Encounter (Signed)
Sent a GI cocktail for the patient, she can try 1 today and then 1 tomorrow and that should hopefully calm things down for her

## 2022-02-25 NOTE — Telephone Encounter (Signed)
Pt called requesting to speak with PCP or nurse regarding the medicine that she is currently taking. (SUCRALFATE)  Please call patient.

## 2022-02-25 NOTE — Telephone Encounter (Signed)
Lmtcb.

## 2022-03-29 ENCOUNTER — Encounter: Payer: Self-pay | Admitting: Family

## 2022-03-29 ENCOUNTER — Ambulatory Visit: Payer: 59 | Admitting: Family

## 2022-03-29 VITALS — BP 126/87 | HR 112 | Temp 98.5°F | Wt 141.0 lb

## 2022-03-29 DIAGNOSIS — K219 Gastro-esophageal reflux disease without esophagitis: Secondary | ICD-10-CM | POA: Diagnosis not present

## 2022-03-29 DIAGNOSIS — B079 Viral wart, unspecified: Secondary | ICD-10-CM

## 2022-03-29 DIAGNOSIS — R1013 Epigastric pain: Secondary | ICD-10-CM

## 2022-03-29 NOTE — Progress Notes (Signed)
Subjective:    Patient ID: Gabrielle Weber, female    DOB: Aug 02, 1990, 32 y.o.   MRN: [id number]  Chief Complaint  Patient presents with   Medical Management of Chronic Issues    Had 2 spells took the liquid meds seem to help.    PT presents to the office today recheck abdominal pain. She was seen on 02/21/22 and we started her on protonix 40 mg daily and GI cocktail as needed. She reports she had two episodes since our last visit and took the GI cocktail and within a few min her pain was improved.   She had a stable CBC.   Complaining of wart on lower left leg that she noticed over a year ago.  Abdominal Pain Associated symptoms include belching and nausea. Her past medical history is significant for GERD.  Gastroesophageal Reflux She complains of abdominal pain, belching, heartburn and nausea. She reports no choking, no coughing, no dysphagia, no early satiety or no globus sensation. This is a new problem. The current episode started more than 1 month ago. The problem occurs frequently. The symptoms are aggravated by smoking, medications, certain foods and ETOH. Risk factors include smoking/tobacco exposure. She has tried a PPI for the symptoms. The treatment provided moderate relief.      Review of Systems  Respiratory:  Negative for cough and choking.   Gastrointestinal:  Positive for abdominal pain, heartburn and nausea. Negative for dysphagia.  All other systems reviewed and are negative.      Objective:   Physical Exam Vitals reviewed.  Constitutional:      General: She is not in acute distress.    Appearance: She is well-developed.  HENT:     Head: Normocephalic and atraumatic.  Eyes:     Pupils: Pupils are equal, round, and reactive to light.  Neck:     Thyroid: No thyromegaly.  Cardiovascular:     Rate and Rhythm: Normal rate and regular rhythm.     Heart sounds: Normal heart sounds. No murmur heard. Pulmonary:     Effort: Pulmonary effort is normal. No  respiratory distress.     Breath sounds: Normal breath sounds. No wheezing.  Abdominal:     General: Bowel sounds are normal. There is no distension.     Palpations: Abdomen is soft.     Tenderness: There is no abdominal tenderness.  Musculoskeletal:        General: No tenderness. Normal range of motion.     Cervical back: Normal range of motion and neck supple.  Skin:    General: Skin is warm and dry.          Comments: Wart on left lower leg  Neurological:     Mental Status: She is alert and oriented to person, place, and time.     Cranial Nerves: No cranial nerve deficit.     Deep Tendon Reflexes: Reflexes are normal and symmetric.  Psychiatric:        Behavior: Behavior normal.        Thought Content: Thought content normal.        Judgment: Judgment normal.    Cryotherapy used on wart on left leg. Pt tolerated well.   BP 126/87   Pulse (!) 112   Temp 98.5 F (36.9 C) (Temporal)   Wt 141 lb (64 kg)   SpO2 98%   BMI 24.98 kg/m       Assessment & Plan:  Gabrielle Weber comes in today with chief complaint of  Medical Management of Chronic Issues (Had 2 spells took the liquid meds seem to help. )   Diagnosis and orders addressed:  1. Gastroesophageal reflux disease, unspecified whether esophagitis present Continue Protonix  -Diet discussed- Avoid fried, spicy, citrus foods, caffeine and alcohol -Do not eat 2-3 hours before bedtime -Encouraged small frequent meals -Avoid NSAID's -Keep chronic follow up Referral to GI pending  - Ambulatory referral to Gastroenterology  2. Epigastric pain - Ambulatory referral to Gastroenterology  3. Viral warts, unspecified type Do not pick at area May blister     Evelina Dun, FNP

## 2022-03-29 NOTE — Patient Instructions (Signed)
Gastroesophageal Reflux Disease, Adult Gastroesophageal reflux (GER) happens when acid from the stomach flows up into the tube that connects the mouth and the stomach (esophagus). Normally, food travels down the esophagus and stays in the stomach to be digested. However, when a person has GER, food and stomach acid sometimes move back up into the esophagus. If this becomes a more serious problem, the person may be diagnosed with a disease called gastroesophageal reflux disease (GERD). GERD occurs when the reflux: Happens often. Causes frequent or severe symptoms. Causes problems such as damage to the esophagus. When stomach acid comes in contact with the esophagus, the acid may cause inflammation in the esophagus. Over time, GERD may create small holes (ulcers) in the lining of the esophagus. What are the causes? This condition is caused by a problem with the muscle between the esophagus and the stomach (lower esophageal sphincter, or LES). Normally, the LES muscle closes after food passes through the esophagus to the stomach. When the LES is weakened or abnormal, it does not close properly, and that allows food and stomach acid to go back up into the esophagus. The LES can be weakened by certain dietary substances, medicines, and medical conditions, including: Tobacco use. Pregnancy. Having a hiatal hernia. Alcohol use. Certain foods and beverages, such as coffee, chocolate, onions, and peppermint. What increases the risk? You are more likely to develop this condition if you: Have an increased body weight. Have a connective tissue disorder. Take NSAIDs, such as ibuprofen. What are the signs or symptoms? Symptoms of this condition include: Heartburn. Difficult or painful swallowing and the feeling of having a lump in the throat. A bitter taste in the mouth. Bad breath and having a large amount of saliva. Having an upset or bloated stomach and belching. Chest pain. Different conditions can  cause chest pain. Make sure you see your health care provider if you experience chest pain. Shortness of breath or wheezing. Ongoing (chronic) cough or a nighttime cough. Wearing away of tooth enamel. Weight loss. How is this diagnosed? This condition may be diagnosed based on a medical history and a physical exam. To determine if you have mild or severe GERD, your health care provider may also monitor how you respond to treatment. You may also have tests, including: A test to examine your stomach and esophagus with a small camera (endoscopy). A test that measures the acidity level in your esophagus. A test that measures how much pressure is on your esophagus. A barium swallow or modified barium swallow test to show the shape, size, and functioning of your esophagus. How is this treated? Treatment for this condition may vary depending on how severe your symptoms are. Your health care provider may recommend: Changes to your diet. Medicine. Surgery. The goal of treatment is to help relieve your symptoms and to prevent complications. Follow these instructions at home: Eating and drinking  Follow a diet as recommended by your health care provider. This may involve avoiding foods and drinks such as: Coffee and tea, with or without caffeine. Drinks that contain alcohol. Energy drinks and sports drinks. Carbonated drinks or sodas. Chocolate and cocoa. Peppermint and mint flavorings. Garlic and onions. Horseradish. Spicy and acidic foods, including peppers, chili powder, curry powder, vinegar, hot sauces, and barbecue sauce. Citrus fruit juices and citrus fruits, such as oranges, lemons, and limes. Tomato-based foods, such as red sauce, chili, salsa, and pizza with red sauce. Fried and fatty foods, such as donuts, french fries, potato chips, and high-fat dressings.   High-fat meats, such as hot dogs and fatty cuts of red and white meats, such as rib eye steak, sausage, ham, and  bacon. High-fat dairy items, such as whole milk, butter, and cream cheese. Eat small, frequent meals instead of large meals. Avoid drinking large amounts of liquid with your meals. Avoid eating meals during the 2-3 hours before bedtime. Avoid lying down right after you eat. Do not exercise right after you eat. Lifestyle  Do not use any products that contain nicotine or tobacco. These products include cigarettes, chewing tobacco, and vaping devices, such as e-cigarettes. If you need help quitting, ask your health care provider. Try to reduce your stress by using methods such as yoga or meditation. If you need help reducing stress, ask your health care provider. If you are overweight, reduce your weight to an amount that is healthy for you. Ask your health care provider for guidance about a safe weight loss goal. General instructions Pay attention to any changes in your symptoms. Take over-the-counter and prescription medicines only as told by your health care provider. Do not take aspirin, ibuprofen, or other NSAIDs unless your health care provider told you to take these medicines. Wear loose-fitting clothing. Do not wear anything tight around your waist that causes pressure on your abdomen. Raise (elevate) the head of your bed about 6 inches (15 cm). You can use a wedge to do this. Avoid bending over if this makes your symptoms worse. Keep all follow-up visits. This is important. Contact a health care provider if: You have: New symptoms. Unexplained weight loss. Difficulty swallowing or it hurts to swallow. Wheezing or a persistent cough. A hoarse voice. Your symptoms do not improve with treatment. Get help right away if: You have sudden pain in your arms, neck, jaw, teeth, or back. You suddenly feel sweaty, dizzy, or light-headed. You have chest pain or shortness of breath. You vomit and the vomit is green, yellow, or black, or it looks like blood or coffee grounds. You faint. You  have stool that is red, bloody, or black. You cannot swallow, drink, or eat. These symptoms may represent a serious problem that is an emergency. Do not wait to see if the symptoms will go away. Get medical help right away. Call your local emergency services (911 in the [place] Do not drive yourself to the hospital. Summary Gastroesophageal reflux happens when acid from the stomach flows up into the esophagus. GERD is a disease in which the reflux happens often, causes frequent or severe symptoms, or causes problems such as damage to the esophagus. Treatment for this condition may vary depending on how severe your symptoms are. Your health care provider may recommend diet and lifestyle changes, medicine, or surgery. Contact a health care provider if you have new or worsening symptoms. Take over-the-counter and prescription medicines only as told by your health care provider. Do not take aspirin, ibuprofen, or other NSAIDs unless your health care provider told you to do so. Keep all follow-up visits as told by your health care provider. This is important. This information is not intended to replace advice given to you by your health care provider. Make sure you discuss any questions you have with your health care provider. Document Revised: 09/16/2019 Document Reviewed: 09/16/2019 Elsevier Patient Education  2023 [employer] Inc[employer]  

## 2022-03-30 ENCOUNTER — Encounter: Payer: Self-pay | Admitting: Gastroenterology

## 2022-04-06 ENCOUNTER — Encounter: Payer: Self-pay | Admitting: Family Medicine

## 2022-04-06 ENCOUNTER — Telehealth: Payer: 59 | Admitting: Family Medicine

## 2022-04-06 DIAGNOSIS — J329 Chronic sinusitis, unspecified: Secondary | ICD-10-CM

## 2022-04-06 DIAGNOSIS — J40 Bronchitis, not specified as acute or chronic: Secondary | ICD-10-CM

## 2022-04-06 MED ORDER — AZITHROMYCIN 250 MG PO TABS: ORAL_TABLET | ORAL | 0 refills | Status: DC

## 2022-04-06 MED ORDER — PSEUDOEPHEDRINE-GUAIFENESIN ER 120-1200 MG PO TB12: 1.0000 | ORAL_TABLET | Freq: Two times a day (BID) | ORAL | 0 refills | Status: DC

## 2022-04-06 MED ORDER — BENZONATATE 200 MG PO CAPS: 200.0000 mg | ORAL_CAPSULE | Freq: Three times a day (TID) | ORAL | 0 refills | Status: DC | PRN

## 2022-04-06 NOTE — Progress Notes (Signed)
Subjective:    Patient ID: Gabrielle Weber, female    DOB: 04-28-1990, 32 y.o.   MRN: [id number]   HPI: Gabrielle Weber is a 32 y.o. female presenting for cough & chest congestion. Ears feel itchy and drainage. Throat is scratchy and sore. Using Mucinex with a little relief. Cough with green mucous. A little dyspneic with cough.      03/29/2022   10:54 AM 02/21/2022    2:57 PM 10/15/2021   10:54 AM 08/24/2016    5:02 PM 07/28/2016   11:54 AM  Depression screen PHQ 2/9  Decreased Interest 0 0 0 0 0  Down, Depressed, Hopeless 0 0 0 0 0  PHQ - 2 Score 0 0 0 0 0  Altered sleeping  0 1    Tired, decreased energy  1 1    Change in appetite  2 0    Feeling bad or failure about yourself   0 0    Trouble concentrating  0 0    Moving slowly or fidgety/restless  0 0    Suicidal thoughts  0 0    PHQ-9 Score  3 2    Difficult doing work/chores  Not difficult at all Not difficult at all       Relevant past medical, surgical, family and social history reviewed and updated as indicated.  Interim medical history since our last visit reviewed. Allergies and medications reviewed and updated.  ROS:  Review of Systems  Constitutional:  Negative for activity change, appetite change, chills and fever.  HENT:  Positive for congestion, postnasal drip, rhinorrhea, sinus pressure and sore throat. Negative for ear discharge, ear pain, hearing loss, nosebleeds, sneezing and trouble swallowing.   Respiratory:  Positive for cough. Negative for chest tightness and shortness of breath.   Cardiovascular:  Negative for chest pain and palpitations.  Skin:  Negative for rash.     Social History   Tobacco Use  Smoking Status Former   Packs/day: 0.50   Types: Cigarettes   Quit date: 01/20/2015   Years since quitting: 7.2  Smokeless Tobacco Never       Objective:     Wt Readings from Last 3 Encounters:  03/29/22 141 lb (64 kg)  02/21/22 145 lb (65.8 kg)  10/15/21 159 lb 3.2 oz (72.2 kg)     Exam  deferred. Pt. Harboring due to COVID 19. Phone visit performed.   Assessment & Plan:   1. Sinobronchitis     Meds ordered this encounter  Medications   azithromycin (ZITHROMAX Z-PAK) 250 MG tablet    Sig: Take two right away Then one a day for the next 4 days.    Dispense:  6 each    Refill:  0   Pseudoephedrine-Guaifenesin [phone] MG TB12    Sig: Take 1 tablet by mouth 2 (two) times daily. For congestion    Dispense:  20 tablet    Refill:  0   benzonatate (TESSALON) 200 MG capsule    Sig: Take 1 capsule (200 mg total) by mouth 3 (three) times daily as needed for cough.    Dispense:  20 capsule    Refill:  0    No orders of the defined types were placed in this encounter.     Diagnoses and all orders for this visit:  Sinobronchitis  Other orders -     azithromycin (ZITHROMAX Z-PAK) 250 MG tablet; Take two right away Then one a day for the next 4 days. -  Pseudoephedrine-Guaifenesin [phone] MG TB12; Take 1 tablet by mouth 2 (two) times daily. For congestion -     benzonatate (TESSALON) 200 MG capsule; Take 1 capsule (200 mg total) by mouth 3 (three) times daily as needed for cough.    Virtual Visit via telephone Note  I discussed the limitations, risks, security and privacy concerns of performing an evaluation and management service by telephone and the availability of in person appointments. The patient was identified with two identifiers. Pt.expressed understanding and agreed to proceed. Pt. Is at home. Dr. Livia Snellen is in his office.  Follow Up Instructions:   I discussed the assessment and treatment plan with the patient. The patient was provided an opportunity to ask questions and all were answered. The patient agreed with the plan and demonstrated an understanding of the instructions.   The patient was advised to call back or seek an in-person evaluation if the symptoms worsen or if the condition fails to improve as anticipated.   Total minutes including chart  review and phone contact time: 11   Follow up plan: No follow-ups on file.  Gabrielle Fraise, MD [place]

## 2022-04-21 ENCOUNTER — Encounter: Payer: Self-pay | Admitting: Gastroenterology

## 2022-04-21 ENCOUNTER — Ambulatory Visit: Payer: 59 | Admitting: Gastroenterology

## 2022-04-21 ENCOUNTER — Other Ambulatory Visit: Payer: 59

## 2022-04-21 VITALS — BP 130/78 | HR 118 | Ht 63.0 in | Wt 141.0 lb

## 2022-04-21 DIAGNOSIS — R1010 Upper abdominal pain, unspecified: Secondary | ICD-10-CM | POA: Diagnosis not present

## 2022-04-21 DIAGNOSIS — R12 Heartburn: Secondary | ICD-10-CM | POA: Diagnosis not present

## 2022-04-21 DIAGNOSIS — R1084 Generalized abdominal pain: Secondary | ICD-10-CM

## 2022-04-21 MED ORDER — FAMOTIDINE 40 MG PO TABS: 40.0000 mg | ORAL_TABLET | Freq: Two times a day (BID) | ORAL | 3 refills | Status: DC | PRN

## 2022-04-21 NOTE — Progress Notes (Signed)
HPI : Gabrielle Weber is a very pleasant 32 year old female with a history of anxiety who is referred to [place] by Evelina Dun, FNP for further evaluation of chronic upper abdominal pain.  The pain is episodic, but is usually quite severe.  The last episode occurred over a month ago.  She described these episodes as intense discomfort in the upper abdomen, pressure-like in quality, almost like something was stuck in her stomach.  Sometimes she feels a burning sensation.  The pain is accompanied by nausea and diaphoresis.  It is improved somewhat with belching and if she vomits, she will feel much better.   She has noted that these symptoms typically occurred after eating foods that were high in fat.  Her last episode occurred the evening she ate pizza and had a mixed drink.  The time before that was after Thanksgiving.   She has made some dietary changes which she feels explains why she has not had an episode in over a month.  She has been following a blander diet, avoiding fat and grease, and avoiding over-eating.  She denies any GI symptoms on this diet.  She denies heartburn, acid regurgitation or dysphagia currently.  She is taking Protonix daily currently.  Prior to that she had been on omeprazole for many years for infrequent heartburn. She has lost some weight because she has been eating healthier and smaller portions. She reports regular bowel movements, occasionally with loose stools.  She denies constipation. She had been taking ibuprofen frequently, but stopped taking over a month ago.  Now takes Tylenol as needed for headaches/MSK/menstrual pain    History reviewed. No pertinent past medical history. Anxiety Allergic rhinitis   History reviewed. No pertinent surgical history. Family History  Problem Relation Age of Onset   Fibromyalgia Mother    Hypertension Father    Social History   Tobacco Use   Smoking status: Every Day    Packs/day: 0.50    Types: Cigarettes    Last attempt  to quit: 01/20/2015    Years since quitting: 7.2   Smokeless tobacco: Never  Vaping Use   Vaping Use: Never used  Substance Use Topics   Alcohol use: No   Drug use: No   Current Outpatient Medications  Medication Sig Dispense Refill   ALPRAZolam (XANAX) 0.25 MG tablet Take 1 tablet (0.25 mg total) by mouth at bedtime as needed for anxiety. 20 tablet 2   cetirizine (ZYRTEC) 10 MG tablet Take 1 tablet (10 mg total) by mouth daily. 90 tablet 1   GI Cocktail (alum & mag hydroxide, lidocaine, dicyclomine) oral mixture Take 5 mLs by mouth daily as needed. Mix equal parts Maalox lidocaine and dicyclomine. 550 mL 0   pantoprazole (PROTONIX) 40 MG tablet Take 1 tablet (40 mg total) by mouth daily. 90 tablet 1   benzonatate (TESSALON) 200 MG capsule Take 1 capsule (200 mg total) by mouth 3 (three) times daily as needed for cough. 20 capsule 0   No current facility-administered medications for this visit.   Allergies  Allergen Reactions   Augmentin [Amoxicillin-Pot Clavulanate]    Bactrim [Sulfamethoxazole-Trimethoprim]      Review of Systems: All systems reviewed and negative except where noted in HPI.    No results found.  Physical Exam: BP 130/78 (BP Location: Left Arm, Patient Position: Sitting)   Pulse (!) 118   Ht 5\' 3"  (1.6 m)   Wt 141 lb (64 kg)   SpO2 100%   BMI 24.98  kg/m  Constitutional: Pleasant,well-developed, Caucasian female in no acute distress.  Accompanied by husband HEENT: Normocephalic and atraumatic. Conjunctivae are normal. No scleral icterus. Neck supple.  Cardiovascular: Normal rate, regular rhythm.  Pulmonary/chest: Effort normal and breath sounds normal. No wheezing, rales or rhonchi. Abdominal: Soft, nondistended, nontender. Bowel sounds active throughout. There are no masses palpable. No hepatomegaly. Extremities: no edema Neurological: Alert and oriented to person place and time. Skin: Skin is warm and dry. No rashes noted. Psychiatric: Normal mood  and affect. Behavior is normal.  CBC    Component Value Date/Time   WBC 9.8 02/21/2022 1522   RBC 4.23 02/21/2022 1522   HGB 13.1 02/21/2022 1522   HCT 37.8 02/21/2022 1522   PLT 198 02/21/2022 1522   MCV 89 02/21/2022 1522   MCH 31.0 02/21/2022 1522   MCHC 34.7 02/21/2022 1522   RDW 12.2 02/21/2022 1522   LYMPHSABS 3.4 (H) 02/21/2022 1522   EOSABS 0.4 02/21/2022 1522   BASOSABS 0.1 02/21/2022 1522    CMP     Component Value Date/Time   NA 141 02/21/2022 1522   K 3.7 02/21/2022 1522   CL 103 02/21/2022 1522   CO2 21 02/21/2022 1522   GLUCOSE 105 (H) 02/21/2022 1522   BUN 6 02/21/2022 1522   CREATININE 0.79 02/21/2022 1522   CALCIUM 9.4 02/21/2022 1522   PROT 7.5 02/21/2022 1522   ALBUMIN 4.9 02/21/2022 1522   AST 40 02/21/2022 1522   ALT 12 02/21/2022 1522   ALKPHOS 70 02/21/2022 1522   BILITOT 0.7 02/21/2022 1522     ASSESSMENT AND PLAN: 32 year old female with episodic upper abdominal pain with nausea, vomiting and diaphoresis which is associated with consumption of heavy meals.  No symptoms in between episodes.  History is suggestive of symptomatic cholelithiasis.  Will get RUQUS. The patient has been taking a daily PPI for many years for heartburn and has not tried stopping it.  She states that her heartburn was not very frequent when she was having it.  I recommended she try taking Pepcic +/- antacids as needed rather than a daily PPI if she does not have frequent symptoms (more than 2 days per week). Recommended patient start taking Protonix every other day for 2 weeks then stop.  Will plan to check H. Pylori stool antigen once patient has been off PPI for 2 weeks.  Epigastric pain, episodic - RUQUS - H. Pylori stool test after 2 weeks off PPI - Consider EGD if [place] and H. Pylori negative and patient continues to have symptoms.  Heartburn - Wean off Protonix (take every other day for 2 weeks then stop) - Use Pepcid 40 mg as needed for heartburn   Rosela Supak E.  Candis Schatz, MD [facility]  CC:  Sharion Balloon, FNP

## 2022-04-21 NOTE — Patient Instructions (Addendum)
If you are age 31 or older, your body mass index should be between 23-30. Your Body mass index is 24.98 kg/m. If this is out of the aforementioned range listed, please consider follow up with your Primary Care Provider.  If you are age 59 or younger, your body mass index should be between 19-25. Your Body mass index is 24.98 kg/m. If this is out of the aformentioned range listed, please consider follow up with your Primary Care Provider.   Wean off of Protonix every other day for 2 weeks.   We have sent the following medications to your pharmacy for you to pick up at your convenience: Pepcid 40 mg Take one tablet by mouth every 12 hours as needed.  You have been scheduled for an abdominal ultrasound at [facility] (1st floor of hospital) on 04/26/22 at [place]. Please arrive 30 minutes prior to your appointment for registration. Make certain not to have anything to eat or drink 6 hours prior to your appointment. Should you need to reschedule your appointment, please contact radiology at [phone]. This test typically takes about 30 minutes to perform.   The [facility] providers would like to encourage you to use [facility] to communicate with providers for non-urgent requests or questions.  Due to long hold times on the telephone, sending your provider a message by [facility] may be a faster and more efficient way to get a response.  Please allow 48 business hours for a response.  Please remember that this is for non-urgent requests.   It was a pleasure to see you today!  Thank you for trusting me with your gastrointestinal care!    Scott E.Candis Schatz, MD

## 2022-04-26 ENCOUNTER — Ambulatory Visit (HOSPITAL_COMMUNITY)
Admission: RE | Admit: 2022-04-26 | Discharge: 2022-04-26 | Disposition: A | Discharge status: Home / Self Care | Payer: 59 | Source: Ambulatory Visit | Attending: Gastroenterology | Admitting: Gastroenterology

## 2022-04-26 DIAGNOSIS — R1010 Upper abdominal pain, unspecified: Secondary | ICD-10-CM | POA: Diagnosis present

## 2022-04-26 NOTE — Progress Notes (Signed)
Gabrielle Weber,  Your ultrasound showed gallstones.  This is a likely source of your recurrent abdominal pain.  I would recommend you meet with a surgeon to discuss having your gallbladder removed. We will place a referral for you and they will contact you to schedule an appointment.  In addition, you had some fatty change to the liver.  Fatty change is fairly common and can be secondary to chronic alcohol use, or be related to diet and exercise habits.  Having fatty liver indicates that you have some degree of chronic liver damage, and will be at risk for potential further liver problems in the future.  I would recommend infrequent alcohol use and practice healthy eating habits.  Please follow up with me in the clinic to discuss further.

## 2022-05-13 ENCOUNTER — Ambulatory Visit: Payer: Self-pay | Admitting: Surgery

## 2022-05-13 NOTE — H&P (Signed)
Subjective    Chief Complaint: New Consultation (Gallstones )       History of Present Illness: Gabrielle Weber is a 32 y.o. female who is seen today as an office consultation at the request of Dr. Candis Schatz for evaluation of New Consultation (Gallstones ) .   This is a 32 year old female in good health who presents with several months of intermittent episodes of severe epigastric abdominal pain that radiates around her upper abdomen to her back.  This is associated with some abdominal bloating.  This tends to happen after eating, especially fatty foods.  She denies any associated nausea but she did make herself throw up a couple of times which seemed to help alleviate the pain.  She was referred to GI for evaluation.  They suspected gallbladder disease so an ultrasound was obtained.  This revealed cholelithiasis without sign of cholecystitis.  Liver function test were within normal limits.  She is referred to [place] to discuss cholecystectomy.   Review of Systems: A complete review of systems was obtained from the patient.  I have reviewed this information and discussed as appropriate with the patient.  See HPI as well for other ROS.   ROS      Medical History: Past Medical History      Past Medical History:  Diagnosis Date   Anxiety     GERD (gastroesophageal reflux disease)             Patient Active Problem List  Diagnosis   Calculus of gallbladder with chronic cholecystitis without obstruction   GAD (generalized anxiety disorder)   Gastroesophageal reflux disease with esophagitis and hemorrhage      Past Surgical History  History reviewed. No pertinent surgical history.      Allergies      Allergies  Allergen Reactions   Amoxicillin-Pot Clavulanate Hives   Sulfamethoxazole-Trimethoprim Hives              Current Outpatient Medications on File Prior to Visit  Medication Sig Dispense Refill   acetaminophen (TYLENOL) 500 MG tablet Take by mouth       ALPRAZolam  (XANAX) 0.25 MG tablet Take by mouth       cetirizine (ZYRTEC) 10 MG tablet Take 10 mg by mouth once daily       famotidine (PEPCID) 40 MG tablet Take by mouth        No current facility-administered medications on file prior to visit.      Family History  History reviewed. No pertinent family history.      Social History        Tobacco Use  Smoking Status Former   Types: Cigarettes   Quit date: 04/2022   Years since quitting: 0.0  Smokeless Tobacco Never      Social History  Social History         Socioeconomic History   Marital status: Married  Tobacco Use   Smoking status: Former      Types: Cigarettes      Quit date: 04/2022      Years since quitting: 0.0   Smokeless tobacco: Never  Substance and Sexual Activity   Alcohol use: Yes   Drug use: Never        Objective:         Vitals:    05/13/22 1031  BP: 119/82  Pulse: 90  Temp: 36.7 C (98 F)  SpO2: 98%  Weight: 64.4 kg (142 lb)  Height: 160 cm ('5\' 3"'$ )  Body mass index is 25.15 kg/m.   Physical Exam    Constitutional:  WDWN in NAD, conversant, no obvious deformities; lying in bed comfortably Eyes:  Pupils equal, round; sclera anicteric; moist conjunctiva; no lid lag HENT:  Oral mucosa moist; good dentition  Neck:  No masses palpated, trachea midline; no thyromegaly Lungs:  CTA bilaterally; normal respiratory effort Breasts:  symmetric, no nipple changes; no palpable masses or lymphadenopathy on either side CV:  Regular rate and rhythm; no murmurs; extremities well-perfused with no edema Abd:  +bowel sounds, soft, non-tender, no palpable organomegaly; no palpable hernias Musc: Normal gait; no apparent clubbing or cyanosis in extremities Lymphatic:  No palpable cervical or axillary lymphadenopathy Skin:  Warm, dry; no sign of jaundice Psychiatric - alert and oriented x 4; calm mood and affect     Labs, Imaging and Diagnostic Testing: LFT's WNL   CLINICAL DATA:  Abdominal pain    EXAM: ULTRASOUND ABDOMEN LIMITED RIGHT UPPER QUADRANT   COMPARISON:  None Available.   FINDINGS: Gallbladder:   Cholelithiasis measuring up to 12 mm. No gallbladder wall thickening or pericholecystic fluid. Negative sonographic Murphy's sign.   Common bile duct:   Diameter: 2.7 mm   Liver:   Increased echogenicity. No focal lesion. Portal vein is patent on color Doppler imaging with normal direction of blood flow towards the liver.   Other: None.   IMPRESSION: 1. Cholelithiasis without secondary signs of acute cholecystitis. 2. Increased hepatic parenchymal echogenicity suggestive of steatosis.     Electronically Signed   By: Lovey Newcomer M.D.   On: 04/26/2022 09:19     Assessment and Plan:  Diagnoses and all orders for this visit:   Calculus of gallbladder with chronic cholecystitis without obstruction     Recommend laparoscopic cholecystectomy with intraoperative cholangiogram.The surgical procedure has been discussed with the patient.  Potential risks, benefits, alternative treatments, and expected outcomes have been explained.  All of the patient's questions at this time have been answered.  The likelihood of reaching the patient's treatment goal is good.  The patient understand the proposed surgical procedure and wishes to proceed.     No follow-ups on file.   Carlean Jews, MD  05/13/2022 12:17 PM

## 2022-05-13 NOTE — H&P (View-Only) (Signed)
Subjective    Chief Complaint: New Consultation (Gallstones )       History of Present Illness: Gabrielle Weber is a 32 y.o. female who is seen today as an office consultation at the request of Dr. Cunningham for evaluation of New Consultation (Gallstones ) .   This is a 32-year-old female in good health who presents with several months of intermittent episodes of severe epigastric abdominal pain that radiates around her upper abdomen to her back.  This is associated with some abdominal bloating.  This tends to happen after eating, especially fatty foods.  She denies any associated nausea but she did make herself throw up a couple of times which seemed to help alleviate the pain.  She was referred to GI for evaluation.  They suspected gallbladder disease so an ultrasound was obtained.  This revealed cholelithiasis without sign of cholecystitis.  Liver function test were within normal limits.  She is referred to us to discuss cholecystectomy.   Review of Systems: A complete review of systems was obtained from the patient.  I have reviewed this information and discussed as appropriate with the patient.  See HPI as well for other ROS.   ROS      Medical History: Past Medical History      Past Medical History:  Diagnosis Date   Anxiety     GERD (gastroesophageal reflux disease)             Patient Active Problem List  Diagnosis   Calculus of gallbladder with chronic cholecystitis without obstruction   GAD (generalized anxiety disorder)   Gastroesophageal reflux disease with esophagitis and hemorrhage      Past Surgical History  History reviewed. No pertinent surgical history.      Allergies      Allergies  Allergen Reactions   Amoxicillin-Pot Clavulanate Hives   Sulfamethoxazole-Trimethoprim Hives              Current Outpatient Medications on File Prior to Visit  Medication Sig Dispense Refill   acetaminophen (TYLENOL) 500 MG tablet Take by mouth       ALPRAZolam  (XANAX) 0.25 MG tablet Take by mouth       cetirizine (ZYRTEC) 10 MG tablet Take 10 mg by mouth once daily       famotidine (PEPCID) 40 MG tablet Take by mouth        No current facility-administered medications on file prior to visit.      Family History  History reviewed. No pertinent family history.      Social History        Tobacco Use  Smoking Status Former   Types: Cigarettes   Quit date: 04/2022   Years since quitting: 0.0  Smokeless Tobacco Never      Social History  Social History         Socioeconomic History   Marital status: Married  Tobacco Use   Smoking status: Former      Types: Cigarettes      Quit date: 04/2022      Years since quitting: 0.0   Smokeless tobacco: Never  Substance and Sexual Activity   Alcohol use: Yes   Drug use: Never        Objective:         Vitals:    05/13/22 1031  BP: 119/82  Pulse: 90  Temp: 36.7 C (98 F)  SpO2: 98%  Weight: 64.4 kg (142 lb)  Height: 160 cm (5' 3")      Body mass index is 25.15 kg/m.   Physical Exam    Constitutional:  WDWN in NAD, conversant, no obvious deformities; lying in bed comfortably Eyes:  Pupils equal, round; sclera anicteric; moist conjunctiva; no lid lag HENT:  Oral mucosa moist; good dentition  Neck:  No masses palpated, trachea midline; no thyromegaly Lungs:  CTA bilaterally; normal respiratory effort Breasts:  symmetric, no nipple changes; no palpable masses or lymphadenopathy on either side CV:  Regular rate and rhythm; no murmurs; extremities well-perfused with no edema Abd:  +bowel sounds, soft, non-tender, no palpable organomegaly; no palpable hernias Musc: Normal gait; no apparent clubbing or cyanosis in extremities Lymphatic:  No palpable cervical or axillary lymphadenopathy Skin:  Warm, dry; no sign of jaundice Psychiatric - alert and oriented x 4; calm mood and affect     Labs, Imaging and Diagnostic Testing: LFT's WNL   CLINICAL DATA:  Abdominal pain    EXAM: ULTRASOUND ABDOMEN LIMITED RIGHT UPPER QUADRANT   COMPARISON:  None Available.   FINDINGS: Gallbladder:   Cholelithiasis measuring up to 12 mm. No gallbladder wall thickening or pericholecystic fluid. Negative sonographic Murphy's sign.   Common bile duct:   Diameter: 2.7 mm   Liver:   Increased echogenicity. No focal lesion. Portal vein is patent on color Doppler imaging with normal direction of blood flow towards the liver.   Other: None.   IMPRESSION: 1. Cholelithiasis without secondary signs of acute cholecystitis. 2. Increased hepatic parenchymal echogenicity suggestive of steatosis.     Electronically Signed   By: Drew  Davis M.D.   On: 04/26/2022 09:19     Assessment and Plan:  Diagnoses and all orders for this visit:   Calculus of gallbladder with chronic cholecystitis without obstruction     Recommend laparoscopic cholecystectomy with intraoperative cholangiogram.The surgical procedure has been discussed with the patient.  Potential risks, benefits, alternative treatments, and expected outcomes have been explained.  All of the patient's questions at this time have been answered.  The likelihood of reaching the patient's treatment goal is good.  The patient understand the proposed surgical procedure and wishes to proceed.     No follow-ups on file.   Nathalee Smarr KAI Yohance Hathorne, MD  05/13/2022 12:17 PM   

## 2022-05-30 NOTE — Pre-Procedure Instructions (Signed)
Surgical Instructions    Your procedure is scheduled on Wednesday 06/19/22.   Report to [facility] Main Entrance "A" at 06:30 A.M., then check in with the Admitting office.  Call this number if you have problems the morning of surgery:  [phone]   If you have any questions prior to your surgery date call [phone]: Open Monday-Friday 8am-4pm If you experience any cold or flu symptoms such as cough, fever, chills, shortness of breath, etc. between now and your scheduled surgery, please notify us at the above number     Remember:  Do not eat after midnight the night before your surgery  You may drink clear liquids until 05:30 A.M. the morning of your surgery.   Clear liquids allowed are: Water, Non-Citrus Juices (without pulp), Carbonated Beverages, Clear Tea, Black Coffee ONLY (NO MILK, CREAM OR POWDERED CREAMER of any kind), and Gatorade  Patient Instructions  The night before surgery:  No food after midnight. ONLY clear liquids after midnight  The day of surgery (if you do NOT have diabetes):  Drink ONE (1) Pre-Surgery Clear Ensure by 05:30 A.M. the morning of surgery. Drink in one sitting. Do not sip.  This drink was given to you during your hospital  pre-op appointment visit.  Nothing else to drink after completing the  Pre-Surgery Clear Ensure.         If you have questions, please contact your surgeon's office.     Take these medicines the morning of surgery with A SIP OF WATER:   cetirizine (ZYRTEC)   pantoprazole (PROTONIX)     ALPRAZolam (XANAX)- If needed  benzonatate (TESSALON)- If needed  famotidine (PEPCID)- If needed   As of today, STOP taking any Aspirin (unless otherwise instructed by your surgeon) Aleve, Naproxen, Ibuprofen, Motrin, Advil, Goody's, BC's, all herbal medications, fish oil, and all vitamins.           Do not wear jewelry or makeup. Do not wear lotions, powders, perfumes/cologne or deodorant. Do not shave 48 hours prior to surgery.   Men may shave face and neck. Do not bring valuables to the hospital. Do not wear nail polish, gel polish, artificial nails, or any other type of covering on natural nails (fingers and toes) If you have artificial nails or gel coating that need to be removed by a nail salon, please have this removed prior to surgery. Artificial nails or gel coating may interfere with anesthesia's ability to adequately monitor your vital signs.  [facility] is not responsible for any belongings or valuables.    Do NOT Smoke (Tobacco/Vaping)  24 hours prior to your procedure  If you use a CPAP at night, you may bring your mask for your overnight stay.   Contacts, glasses, hearing aids, dentures or partials may not be worn into surgery, please bring cases for these belongings   For patients admitted to the hospital, discharge time will be determined by your treatment team.   Patients discharged the day of surgery will not be allowed to drive home, and someone needs to stay with them for 24 hours.   SURGICAL WAITING ROOM VISITATION Patients having surgery or a procedure may have no more than 2 support people in the waiting area - these visitors may rotate.   Children under the age of 46 must have an adult with them who is not the patient. If the patient needs to stay at the hospital during part of their recovery, the visitor guidelines for inpatient rooms apply. Pre-op nurse  will coordinate an appropriate time for 1 support person to accompany patient in pre-op.  This support person may not rotate.   Please refer to [email or web address] for the visitor guidelines for Inpatients (after your surgery is over and you are in a regular room).    Special instructions:    Oral Hygiene is also important to reduce your risk of infection.  Remember - BRUSH YOUR TEETH THE MORNING OF SURGERY WITH YOUR REGULAR TOOTHPASTE   [facility]- Preparing For Surgery  Before  surgery, you can play an important role. Because skin is not sterile, your skin needs to be as free of germs as possible. You can reduce the number of germs on your skin by washing with CHG (chlorahexidine gluconate) Soap before surgery.  CHG is an antiseptic cleaner which kills germs and bonds with the skin to continue killing germs even after washing.     Please do not use if you have an allergy to CHG or antibacterial soaps. If your skin becomes reddened/irritated stop using the CHG.  Do not shave (including legs and underarms) for at least 48 hours prior to first CHG shower. It is OK to shave your face.  Please follow these instructions carefully.     Shower the NIGHT BEFORE SURGERY and the MORNING OF SURGERY with CHG Soap.   If you chose to wash your hair, wash your hair first as usual with your normal shampoo. After you shampoo, rinse your hair and body thoroughly to remove the shampoo.  Then [employer] and genitals (private parts) with your normal soap and rinse thoroughly to remove soap.  After that Use CHG Soap as you would any other liquid soap. You can apply CHG directly to the skin and wash gently with a scrungie or a clean washcloth.   Apply the CHG Soap to your body ONLY FROM THE NECK DOWN.  Do not use on open wounds or open sores. Avoid contact with your eyes, ears, mouth and genitals (private parts). Wash Face and genitals (private parts)  with your normal soap.   Wash thoroughly, paying special attention to the area where your surgery will be performed.  Thoroughly rinse your body with warm water from the neck down.  DO NOT shower/wash with your normal soap after using and rinsing off the CHG Soap.  Pat yourself dry with a CLEAN TOWEL.  Wear CLEAN PAJAMAS to bed the night before surgery  Place CLEAN SHEETS on your bed the night before your surgery  DO NOT SLEEP WITH PETS.   Day of Surgery:  Take a shower with CHG soap. Wear Clean/Comfortable clothing the morning of  surgery Do not apply any deodorants/lotions.   Remember to brush your teeth WITH YOUR REGULAR TOOTHPASTE.    If you received a COVID test during your pre-op visit, it is requested that you wear a mask when out in public, stay away from anyone that may not be feeling well, and notify your surgeon if you develop symptoms. If you have been in contact with anyone that has tested positive in the last 10 days, please notify your surgeon.    Please read over the following fact sheets that you were given.

## 2022-05-31 ENCOUNTER — Encounter (HOSPITAL_COMMUNITY): Payer: Self-pay

## 2022-05-31 ENCOUNTER — Other Ambulatory Visit: Payer: Self-pay

## 2022-05-31 ENCOUNTER — Encounter (HOSPITAL_COMMUNITY)
Admission: RE | Admit: 2022-05-31 | Discharge: 2022-05-31 | Disposition: A | Discharge status: Home / Self Care | Payer: 59 | Source: Ambulatory Visit | Attending: Surgery | Admitting: Surgery

## 2022-05-31 VITALS — BP 124/84 | HR 100 | Temp 98.1°F | Resp 18 | Ht 63.0 in | Wt 146.0 lb

## 2022-05-31 DIAGNOSIS — Z01818 Encounter for other preprocedural examination: Secondary | ICD-10-CM

## 2022-05-31 DIAGNOSIS — Z01812 Encounter for preprocedural laboratory examination: Secondary | ICD-10-CM | POA: Diagnosis present

## 2022-05-31 HISTORY — DX: Anxiety disorder, unspecified: F41.9

## 2022-05-31 HISTORY — DX: Gastro-esophageal reflux disease without esophagitis: K21.9

## 2022-05-31 LAB — CBC
HCT: 39.3 % (ref 36.0–46.0)
Hemoglobin: 13.3 g/dL (ref 12.0–15.0)
MCH: 31.1 pg (ref 26.0–34.0)
MCHC: 33.8 g/dL (ref 30.0–36.0)
MCV: 91.8 fL (ref 80.0–100.0)
Platelets: 247 10*3/uL (ref 150–400)
RBC: 4.28 MIL/uL (ref 3.87–5.11)
RDW: 11.9 % (ref 11.5–15.5)
WBC: 9 10*3/uL (ref 4.0–10.5)
nRBC: 0 % (ref 0.0–0.2)

## 2022-05-31 NOTE — Progress Notes (Signed)
Attempted to call pharmacy tech for medication reconciliation. No answer. Medication list updated by this RN.

## 2022-05-31 NOTE — Anesthesia Preprocedure Evaluation (Signed)
Anesthesia Evaluation  Patient identified by MRN, date of birth, ID band Patient awake    Reviewed: Allergy & Precautions, H&P , NPO status , Patient's Chart, lab work & pertinent test results  Airway Mallampati: II  TM Distance: >3 FB Neck ROM: Full    Dental no notable dental hx. (+) Teeth Intact, Dental Advisory Given   Pulmonary neg pulmonary ROS, former smoker   Pulmonary exam normal breath sounds clear to auscultation       Cardiovascular Exercise Tolerance: Good negative cardio ROS  Rhythm:Regular Rate:Normal     Neuro/Psych   Anxiety     negative neurological ROS     GI/Hepatic Neg liver ROS,GERD  Medicated,,  Endo/Other  negative endocrine ROS    Renal/GU negative Renal ROS  negative genitourinary   Musculoskeletal   Abdominal   Peds  Hematology negative hematology ROS (+)   Anesthesia Other Findings   Reproductive/Obstetrics negative OB ROS                             Anesthesia Physical Anesthesia Plan  ASA: 2  Anesthesia Plan: General   Post-op Pain Management: Tylenol PO (pre-op)* and Toradol IV (intra-op)*   Induction: Intravenous  PONV Risk Score and Plan: 4 or greater and Ondansetron, Dexamethasone and Midazolam  Airway Management Planned: Oral ETT  Additional Equipment:   Intra-op Plan:   Post-operative Plan: Extubation in OR  Informed Consent: I have reviewed the patients History and Physical, chart, labs and discussed the procedure including the risks, benefits and alternatives for the proposed anesthesia with the patient or authorized representative who has indicated his/her understanding and acceptance.     Dental advisory given  Plan Discussed with: CRNA  Anesthesia Plan Comments:        Anesthesia Quick Evaluation

## 2022-05-31 NOTE — Progress Notes (Signed)
PCP - Evelina Dun, FNP at [facility] Cardiologist - n/a  PPM/ICD - n/a Device Orders - n/a Rep Notified - n/a  Chest x-ray - n/a EKG - n/a Stress Test - denies ECHO - denies Cardiac Cath - denies  Sleep Study - denies CPAP - denies  Fasting Blood Sugar - n/a Checks Blood Sugar _____ times a day- n/a  Last dose of GLP1 agonist- n/a GLP1 instructions: n/a  Blood Thinner Instructions: n/a Aspirin Instructions: n/a  ERAS Protcol - Clear liquids until 0530 am day of surgery.  PRE-SURGERY Ensure or G2- Ensure  COVID TEST- n/a   Anesthesia review: No  Patient denies shortness of breath, fever, cough and chest pain at PAT appointment   All instructions explained to the patient, with a verbal understanding of the material. Patient agrees to go over the instructions while at home for a better understanding. The opportunity to ask questions was provided.

## 2022-06-01 ENCOUNTER — Ambulatory Visit (HOSPITAL_COMMUNITY)
Admission: RE | Admit: 2022-06-01 | Discharge: 2022-06-01 | Disposition: A | Discharge status: Home / Self Care | Payer: 59 | Source: Ambulatory Visit | Attending: Surgery | Admitting: Surgery

## 2022-06-01 ENCOUNTER — Ambulatory Visit (HOSPITAL_BASED_OUTPATIENT_CLINIC_OR_DEPARTMENT_OTHER): Payer: 59 | Admitting: Anesthesiology

## 2022-06-01 ENCOUNTER — Encounter (HOSPITAL_COMMUNITY)
Admission: RE | Disposition: A | Discharge status: Home / Self Care | Payer: Self-pay | Source: Ambulatory Visit | Attending: Surgery

## 2022-06-01 ENCOUNTER — Ambulatory Visit (HOSPITAL_COMMUNITY): Payer: 59

## 2022-06-01 ENCOUNTER — Encounter (HOSPITAL_COMMUNITY): Payer: Self-pay | Admitting: Surgery

## 2022-06-01 ENCOUNTER — Other Ambulatory Visit: Payer: Self-pay

## 2022-06-01 ENCOUNTER — Ambulatory Visit (HOSPITAL_COMMUNITY): Payer: 59 | Admitting: Anesthesiology

## 2022-06-01 DIAGNOSIS — K8018 Calculus of gallbladder with other cholecystitis without obstruction: Secondary | ICD-10-CM

## 2022-06-01 DIAGNOSIS — F411 Generalized anxiety disorder: Secondary | ICD-10-CM | POA: Insufficient documentation

## 2022-06-01 DIAGNOSIS — K8010 Calculus of gallbladder with chronic cholecystitis without obstruction: Secondary | ICD-10-CM | POA: Diagnosis not present

## 2022-06-01 DIAGNOSIS — R140 Abdominal distension (gaseous): Secondary | ICD-10-CM | POA: Diagnosis not present

## 2022-06-01 DIAGNOSIS — K2101 Gastro-esophageal reflux disease with esophagitis, with bleeding: Secondary | ICD-10-CM | POA: Diagnosis not present

## 2022-06-01 DIAGNOSIS — Z87891 Personal history of nicotine dependence: Secondary | ICD-10-CM | POA: Insufficient documentation

## 2022-06-01 HISTORY — PX: CHOLECYSTECTOMY: SHX55

## 2022-06-01 LAB — POCT PREGNANCY, URINE: Preg Test, Ur: NEGATIVE

## 2022-06-01 SURGERY — LAPAROSCOPIC CHOLECYSTECTOMY WITH INTRAOPERATIVE CHOLANGIOGRAM
Anesthesia: General | Site: Abdomen

## 2022-06-01 MED ORDER — ROCURONIUM 10MG/ML (10ML) SYRINGE FOR MEDFUSION PUMP - OPTIME
INTRAVENOUS | Status: DC | PRN
  Administered 2022-06-01: 50 mg via INTRAVENOUS
  Administered 2022-06-01: 20 mg via INTRAVENOUS

## 2022-06-01 MED ORDER — KETAMINE HCL 50 MG/5ML IJ SOSY
PREFILLED_SYRINGE | INTRAMUSCULAR | Status: AC
  Filled 2022-06-01: qty 5

## 2022-06-01 MED ORDER — SUGAMMADEX SODIUM 200 MG/2ML IV SOLN
INTRAVENOUS | Status: DC | PRN
  Administered 2022-06-01: 200 mg via INTRAVENOUS

## 2022-06-01 MED ORDER — LACTATED RINGERS IV SOLN: INTRAVENOUS | Status: DC

## 2022-06-01 MED ORDER — PROPOFOL 10 MG/ML IV BOLUS
INTRAVENOUS | Status: AC
  Filled 2022-06-01: qty 20

## 2022-06-01 MED ORDER — KETAMINE HCL 10 MG/ML IJ SOLN
INTRAMUSCULAR | Status: DC | PRN
  Administered 2022-06-01 (×2): 25 mg via INTRAVENOUS

## 2022-06-01 MED ORDER — MIDAZOLAM HCL 2 MG/2ML IJ SOLN
INTRAMUSCULAR | Status: AC
  Filled 2022-06-01: qty 2

## 2022-06-01 MED ORDER — DEXAMETHASONE SODIUM PHOSPHATE 10 MG/ML IJ SOLN
INTRAMUSCULAR | Status: DC | PRN
  Administered 2022-06-01: 5 mg via INTRAVENOUS

## 2022-06-01 MED ORDER — CHLORHEXIDINE GLUCONATE CLOTH 2 % EX PADS: 6.0000 | MEDICATED_PAD | Freq: Once | CUTANEOUS | Status: DC

## 2022-06-01 MED ORDER — BUPIVACAINE-EPINEPHRINE (PF) 0.25% -1:200000 IJ SOLN
INTRAMUSCULAR | Status: AC
  Filled 2022-06-01: qty 30

## 2022-06-01 MED ORDER — KETOROLAC TROMETHAMINE 30 MG/ML IJ SOLN
INTRAMUSCULAR | Status: DC | PRN
  Administered 2022-06-01: 30 mg via INTRAVENOUS

## 2022-06-01 MED ORDER — FENTANYL CITRATE (PF) 250 MCG/5ML IJ SOLN
INTRAMUSCULAR | Status: DC | PRN
  Administered 2022-06-01: 150 ug via INTRAVENOUS
  Administered 2022-06-01: 25 ug via INTRAVENOUS
  Administered 2022-06-01: 50 ug via INTRAVENOUS
  Administered 2022-06-01: 25 ug via INTRAVENOUS

## 2022-06-01 MED ORDER — ACETAMINOPHEN 500 MG PO TABS: 1000.0000 mg | ORAL_TABLET | Freq: Once | ORAL | Status: DC

## 2022-06-01 MED ORDER — 0.9 % SODIUM CHLORIDE (POUR BTL) OPTIME
TOPICAL | Status: DC | PRN
  Administered 2022-06-01: 1000 mL

## 2022-06-01 MED ORDER — BUPIVACAINE-EPINEPHRINE 0.25% -1:200000 IJ SOLN
INTRAMUSCULAR | Status: DC | PRN
  Administered 2022-06-01: 10 mL

## 2022-06-01 MED ORDER — CEFAZOLIN SODIUM-DEXTROSE 2-4 GM/100ML-% IV SOLN
2.0000 g | INTRAVENOUS | Status: AC
  Administered 2022-06-01: 2 g via INTRAVENOUS
  Filled 2022-06-01: qty 100

## 2022-06-01 MED ORDER — HYDROMORPHONE HCL 1 MG/ML IJ SOLN: 0.2500 mg | INTRAMUSCULAR | Status: DC | PRN

## 2022-06-01 MED ORDER — CHLORHEXIDINE GLUCONATE 0.12 % MT SOLN
15.0000 mL | Freq: Once | OROMUCOSAL | Status: AC
  Administered 2022-06-01: 15 mL via OROMUCOSAL
  Filled 2022-06-01: qty 15

## 2022-06-01 MED ORDER — LIDOCAINE HCL (CARDIAC) PF 100 MG/5ML IV SOSY
PREFILLED_SYRINGE | INTRAVENOUS | Status: DC | PRN
  Administered 2022-06-01: 60 mg via INTRATRACHEAL

## 2022-06-01 MED ORDER — FENTANYL CITRATE (PF) 250 MCG/5ML IJ SOLN
INTRAMUSCULAR | Status: AC
  Filled 2022-06-01: qty 5

## 2022-06-01 MED ORDER — ONDANSETRON HCL 4 MG/2ML IJ SOLN
INTRAMUSCULAR | Status: DC | PRN
  Administered 2022-06-01: 4 mg via INTRAVENOUS

## 2022-06-01 MED ORDER — HEMOSTATIC AGENTS (NO CHARGE) OPTIME
TOPICAL | Status: DC | PRN
  Administered 2022-06-01: 1

## 2022-06-01 MED ORDER — SODIUM CHLORIDE 0.9 % IV SOLN
INTRAVENOUS | Status: DC | PRN
  Administered 2022-06-01: 100 mL

## 2022-06-01 MED ORDER — SODIUM CHLORIDE 0.9 % IR SOLN
Status: DC | PRN
  Administered 2022-06-01: 1000 mL

## 2022-06-01 MED ORDER — ACETAMINOPHEN 500 MG PO TABS: 1000.0000 mg | ORAL_TABLET | ORAL | Status: DC

## 2022-06-01 MED ORDER — OXYCODONE HCL 5 MG PO TABS: 5.0000 mg | ORAL_TABLET | Freq: Four times a day (QID) | ORAL | 0 refills | Status: DC | PRN

## 2022-06-01 MED ORDER — PROPOFOL 10 MG/ML IV BOLUS
INTRAVENOUS | Status: DC | PRN
  Administered 2022-06-01: 200 mg via INTRAVENOUS

## 2022-06-01 MED ORDER — ORAL CARE MOUTH RINSE: 15.0000 mL | Freq: Once | OROMUCOSAL | Status: AC

## 2022-06-01 SURGICAL SUPPLY — 47 items
APL PRP STRL LF DISP 70% ISPRP (MISCELLANEOUS) ×1
APL SKNCLS STERI-STRIP NONHPOA (GAUZE/BANDAGES/DRESSINGS) ×1
APPLIER CLIP ROT 10 11.4 M/L (STAPLE) ×1
APR CLP MED LRG 11.4X10 (STAPLE) ×1
BAG COUNTER SPONGE SURGICOUNT (BAG) ×1 IMPLANT
BAG SPEC RTRVL 10 TROC 200 (ENDOMECHANICALS) ×1
BAG SPNG CNTER NS LX DISP (BAG) ×1
BENZOIN TINCTURE PRP APPL 2/3 (GAUZE/BANDAGES/DRESSINGS) ×1 IMPLANT
CANISTER SUCT 3000ML PPV (MISCELLANEOUS) ×1 IMPLANT
CHLORAPREP W/TINT 26 (MISCELLANEOUS) ×1 IMPLANT
CLIP APPLIE ROT 10 11.4 M/L (STAPLE) ×1 IMPLANT
COVER MAYO STAND STRL (DRAPES) ×1 IMPLANT
COVER SURGICAL LIGHT HANDLE (MISCELLANEOUS) ×1 IMPLANT
DRAPE C-ARM 42X120 X-RAY (DRAPES) ×1 IMPLANT
DRSG TEGADERM 2-3/8X2-3/4 SM (GAUZE/BANDAGES/DRESSINGS) ×3 IMPLANT
DRSG TEGADERM 4X4.75 (GAUZE/BANDAGES/DRESSINGS) ×1 IMPLANT
ELECT REM PT RETURN 9FT ADLT (ELECTROSURGICAL) ×1
ELECTRODE REM PT RTRN 9FT ADLT (ELECTROSURGICAL) ×1 IMPLANT
GAUZE SPONGE 2X2 8PLY STRL LF (GAUZE/BANDAGES/DRESSINGS) ×1 IMPLANT
GLOVE BIO SURGEON STRL SZ7 (GLOVE) ×1 IMPLANT
GLOVE BIOGEL PI IND STRL 7.5 (GLOVE) ×1 IMPLANT
GOWN STRL REUS W/ TWL LRG LVL3 (GOWN DISPOSABLE) ×3 IMPLANT
GOWN STRL REUS W/TWL LRG LVL3 (GOWN DISPOSABLE) ×3
HEMOSTAT SNOW SURGICEL 2X4 (HEMOSTASIS) IMPLANT
IRRIG SUCT STRYKERFLOW 2 WTIP (MISCELLANEOUS) ×1
IRRIGATION SUCT STRKRFLW 2 WTP (MISCELLANEOUS) ×1 IMPLANT
KIT BASIN OR (CUSTOM PROCEDURE TRAY) ×1 IMPLANT
KIT TURNOVER KIT B (KITS) ×1 IMPLANT
NS IRRIG 1000ML POUR BTL (IV SOLUTION) ×1 IMPLANT
PAD ARMBOARD 7.5X6 YLW CONV (MISCELLANEOUS) ×1 IMPLANT
POUCH RETRIEVAL ECOSAC 10 (ENDOMECHANICALS) IMPLANT
POUCH RETRIEVAL ECOSAC 10MM (ENDOMECHANICALS) ×1
SCISSORS LAP 5X35 DISP (ENDOMECHANICALS) ×1 IMPLANT
SET CHOLANGIOGRAPH 5 50 .035 (SET/KITS/TRAYS/PACK) ×1 IMPLANT
SET TUBE SMOKE EVAC HIGH FLOW (TUBING) ×1 IMPLANT
SLEEVE Z-THREAD 5X100MM (TROCAR) ×1 IMPLANT
SPECIMEN JAR SMALL (MISCELLANEOUS) ×1 IMPLANT
SPONGE GAUZE 2X2 8PLY STRL LF (GAUZE/BANDAGES/DRESSINGS) IMPLANT
STRIP CLOSURE SKIN 1/2X4 (GAUZE/BANDAGES/DRESSINGS) ×1 IMPLANT
SUT MNCRL AB 4-0 PS2 18 (SUTURE) ×1 IMPLANT
TOWEL GREEN STERILE (TOWEL DISPOSABLE) ×1 IMPLANT
TOWEL GREEN STERILE FF (TOWEL DISPOSABLE) ×1 IMPLANT
TRAY LAPAROSCOPIC MC (CUSTOM PROCEDURE TRAY) ×1 IMPLANT
TROCAR 11X100 Z THREAD (TROCAR) ×1 IMPLANT
TROCAR BALLN 12MMX100 BLUNT (TROCAR) ×1 IMPLANT
TROCAR Z-THREAD OPTICAL 5X100M (TROCAR) ×1 IMPLANT
WATER STERILE IRR 1000ML POUR (IV SOLUTION) ×1 IMPLANT

## 2022-06-01 NOTE — Transfer of Care (Signed)
Immediate Anesthesia Transfer of Care Note  Patient: Gabrielle Weber  Procedure(s) Performed: LAPAROSCOPIC CHOLECYSTECTOMY WITH INTRAOPERATIVE CHOLANGIOGRAM (Abdomen)  Patient Location: PACU  Anesthesia Type:General  Level of Consciousness: awake and patient cooperative  Airway & Oxygen Therapy: Patient Spontanous Breathing and Patient connected to face mask oxygen  Post-op Assessment: Report given to RN, Post -op Vital signs reviewed and stable, and Patient moving all extremities  Post vital signs: Reviewed and stable  Last Vitals:  Vitals Value Taken Time  BP 127/82 06/01/22 1015  Temp    Pulse 88 06/01/22 1016  Resp 17 06/01/22 1016  SpO2 97 % 06/01/22 1016  Vitals shown include unvalidated device data.  Last Pain:  Vitals:   06/01/22 0708  TempSrc:   PainSc: 0-No pain      Patients Stated Pain Goal: 0 ([id number])  Complications: No notable events documented.

## 2022-06-01 NOTE — Anesthesia Postprocedure Evaluation (Signed)
Anesthesia Post Note  Patient: Gabrielle Weber  Procedure(s) Performed: LAPAROSCOPIC CHOLECYSTECTOMY WITH INTRAOPERATIVE CHOLANGIOGRAM (Abdomen)     Patient location during evaluation: PACU Anesthesia Type: General Level of consciousness: awake and alert Pain management: pain level controlled Vital Signs Assessment: post-procedure vital signs reviewed and stable Respiratory status: spontaneous breathing, nonlabored ventilation and respiratory function stable Cardiovascular status: blood pressure returned to baseline and stable Postop Assessment: no apparent nausea or vomiting Anesthetic complications: no  No notable events documented.  Last Vitals:  Vitals:   06/01/22 1030 06/01/22 1045  BP: 120/76 120/79  Pulse: 85 84  Resp: 10 12  Temp:  37.3 C  SpO2: 98% 98%    Last Pain:  Vitals:   06/01/22 1045  TempSrc:   PainSc: 2                  Isahi Godwin,W. EDMOND

## 2022-06-01 NOTE — Discharge Instructions (Signed)
[facility] ______[facility] LAPAROSCOPIC SURGERY: POST OP INSTRUCTIONS Always review your discharge instruction sheet given to you by the facility where your surgery was performed. IF YOU HAVE DISABILITY OR FAMILY LEAVE FORMS, YOU MUST BRING THEM TO THE OFFICE FOR PROCESSING.   DO NOT GIVE THEM TO YOUR DOCTOR.  A prescription for pain medication may be given to you upon discharge.  Take your pain medication as prescribed, if needed.  If narcotic pain medicine is not needed, then you may take acetaminophen (Tylenol) or ibuprofen (Advil) as needed. Take your usually prescribed medications unless otherwise directed. If you need a refill on your pain medication, please contact your pharmacy.  They will contact our office to request authorization. Prescriptions will not be filled after 5pm or on week-ends. You should follow a light diet the first few days after arrival home, such as soup and crackers, etc.  Be sure to include lots of fluids daily. Most patients will experience some swelling and bruising in the area of the incisions.  Ice packs will help.  Swelling and bruising can take several days to resolve.  It is common to experience some constipation if taking pain medication after surgery.  Increasing fluid intake and taking a stool softener (such as Colace) will usually help or prevent this problem from occurring.  A mild laxative (Milk of Magnesia or Miralax) should be taken according to package instructions if there are no bowel movements after 48 hours. Unless discharge instructions indicate otherwise, you may remove your bandages 24-48 hours after surgery, and you may shower at that time.  You may have steri-strips (small skin tapes) in place directly over the incision.  These strips should be left on the skin for 7-10 days.  If your surgeon used skin glue on the incision, you may shower in 24 hours.  The glue will flake off over the next 2-3 weeks.  Any sutures or staples will be  removed at the office during your follow-up visit. ACTIVITIES:  You may resume regular (light) daily activities beginning the next day--such as daily self-care, walking, climbing stairs--gradually increasing activities as tolerated.  You may have sexual intercourse when it is comfortable.  Refrain from any heavy lifting or straining until approved by your doctor. You may drive when you are no longer taking prescription pain medication, you can comfortably wear a seatbelt, and you can safely maneuver your car and apply brakes. RETURN TO WORK:  __________________________________________________________ You should see your doctor in the office for a follow-up appointment approximately 2-3 weeks after your surgery.  Make sure that you call for this appointment within a day or two after you arrive home to insure a convenient appointment time. OTHER INSTRUCTIONS: __________________________________________________________________________________________________________________________ __________________________________________________________________________________________________________________________ WHEN TO CALL YOUR DOCTOR: Fever over 101.0 Inability to urinate Continued bleeding from incision. Increased pain, redness, or drainage from the incision. Increasing abdominal pain  The clinic staff is available to answer your questions during regular business hours.  Please don't hesitate to call and ask to speak to one of the nurses for clinical concerns.  If you have a medical emergency, go to the nearest emergency room or call 911.  A surgeon from [facility] is always on call at the hospital. [address], [address], [place], [place]  [address] ? [address] [address], [place], [place]   [address] [phone] ? [phone] ? FAX [phone] Web site: [email or web address]  

## 2022-06-01 NOTE — Progress Notes (Signed)
Dr. Georgette Dover made aware of patient's allergy Augmentin (Amoxicillin pot Clavulanate). Ok to give Ancef 2 g per Dr. Georgette Dover.

## 2022-06-01 NOTE — Anesthesia Procedure Notes (Signed)
Procedure Name: Intubation Date/Time: 06/01/2022 8:42 AM  Performed by: Elvin So, CRNAPre-anesthesia Checklist: Patient identified, Emergency Drugs available, Suction available and Patient being monitored Patient Re-evaluated:Patient Re-evaluated prior to induction Oxygen Delivery Method: Circle System Utilized Preoxygenation: Pre-oxygenation with 100% oxygen Induction Type: IV induction Ventilation: Mask ventilation without difficulty Laryngoscope Size: Mac and 3 Grade View: Grade I Tube type: Oral Tube size: 7.0 mm Number of attempts: 1 Airway Equipment and Method: Stylet and Oral airway Placement Confirmation: ETT inserted through vocal cords under direct vision, positive ETCO2 and breath sounds checked- equal and bilateral Secured at: 22 cm Tube secured with: Tape Dental Injury: Teeth and Oropharynx as per pre-operative assessment

## 2022-06-01 NOTE — Interval H&P Note (Signed)
History and Physical Interval Note:  06/01/2022 8:15 AM  Gabrielle Weber  has presented today for surgery, with the diagnosis of CHRONIC CALCULUS CHOLECYSTITIS.  The various methods of treatment have been discussed with the patient and family. After consideration of risks, benefits and other options for treatment, the patient has consented to  Procedure(s): LAPAROSCOPIC CHOLECYSTECTOMY WITH INTRAOPERATIVE CHOLANGIOGRAM (N/A) as a surgical intervention.  The patient's history has been reviewed, patient examined, no change in status, stable for surgery.  I have reviewed the patient's chart and labs.  Questions were answered to the patient's satisfaction.     Maia Petties

## 2022-06-01 NOTE — Op Note (Signed)
Laparoscopic Cholecystectomy with IOC Procedure Note  Indications:  This is a 32 year old female in good health who presents with several months of intermittent episodes of severe epigastric abdominal pain that radiates around her upper abdomen to her back.  This is associated with some abdominal bloating.  This tends to happen after eating, especially fatty foods.  She denies any associated nausea but she did make herself throw up a couple of times which seemed to help alleviate the pain.  She was referred to GI for evaluation.  They suspected gallbladder disease so an ultrasound was obtained.  This revealed cholelithiasis without sign of cholecystitis.  Liver function test were within normal limits.  She is referred to [place] to discuss cholecystectomy.   Pre-operative Diagnosis: Calculus of gallbladder with other cholecystitis, without mention of obstruction  Post-operative Diagnosis: Same  Surgeon: Maia Petties   Assistants: Gabriel Carina RNFA  Anesthesia: General endotracheal anesthesia  ASA Class: 1  Procedure Details  The patient was seen again in the Holding Room. The risks, benefits, complications, treatment options, and expected outcomes were discussed with the patient. The possibilities of reaction to medication, pulmonary aspiration, perforation of viscus, bleeding, recurrent infection, finding a normal gallbladder, the need for additional procedures, failure to diagnose a condition, the possible need to convert to an open procedure, and creating a complication requiring transfusion or operation were discussed with the patient. The likelihood of improving the patient's symptoms with return to their baseline status is good.  The patient and/or family concurred with the proposed plan, giving informed consent. The site of surgery properly noted. The patient was taken to Operating Room, identified as Gabrielle Weber and the procedure verified as Laparoscopic Cholecystectomy with Intraoperative  Cholangiogram. A Time Out was held and the above information confirmed.  Prior to the induction of general anesthesia, antibiotic prophylaxis was administered. General endotracheal anesthesia was then administered and tolerated well. After the induction, the abdomen was prepped with Chloraprep and draped in the sterile fashion. The patient was positioned in the supine position.  Local anesthetic agent was injected into the skin near the umbilicus and an incision made. We dissected down to the abdominal fascia with blunt dissection.  The fascia was incised vertically and we entered the peritoneal cavity bluntly.  A pursestring suture of 0-Vicryl was placed around the fascial opening.  The Hasson cannula was inserted and secured with the stay suture.  Pneumoperitoneum was then created with CO2 and tolerated well without any adverse changes in the patient's vital signs. An 11-mm port was placed in the subxiphoid position.  Two 5-mm ports were placed in the right upper quadrant. All skin incisions were infiltrated with a local anesthetic agent before making the incision and placing the trocars.   We positioned the patient in reverse Trendelenburg, tilted slightly to the patient's left.  The gallbladder was identified, the fundus grasped and retracted cephalad. The gallbladder shows some chronic scarring and adhesions.  Adhesions were lysed bluntly and with the electrocautery where indicated, taking care not to injure any adjacent organs or viscus. The infundibulum was grasped and retracted laterally, exposing the peritoneum overlying the triangle of Calot. This was then divided and exposed in a blunt fashion. A critical view of the cystic duct and cystic artery was obtained.  The cystic duct was clearly identified and bluntly dissected circumferentially. The cystic duct was ligated with a clip distally.   An incision was made in the cystic duct and the [facility] cholangiogram catheter introduced. The catheter was  secured using a clip. A cholangiogram was then obtained which showed good visualization of the distal and proximal biliary tree with no sign of obstruction.  Contrast flowed easily into the duodenum. There are a few small filling defects in the distal common bile duct that are not obstructive.  These may represent air bubbles.  The catheter was then removed.   The cystic duct was then ligated with clips and divided. The cystic artery was identified, dissected free, ligated with clips and divided as well.   The gallbladder was dissected from the liver bed in retrograde fashion with the electrocautery. The gallbladder was removed and placed in an Eco sac. The liver bed was irrigated and inspected. Hemostasis was achieved with the electrocautery and SNOW. Copious irrigation was utilized and was repeatedly aspirated until clear.  The gallbladder and Eco sac were then removed through the umbilical port site.  The pursestring suture was used to close the umbilical fascia.    We again inspected the right upper quadrant for hemostasis.  Pneumoperitoneum was released as we removed the trocars.  4-0 Monocryl was used to close the skin.   Benzoin, steri-strips, and clean dressings were applied. The patient was then extubated and brought to the recovery room in stable condition. Instrument, sponge, and needle counts were correct at closure and at the conclusion of the case.   Findings: Cholecystitis with Cholelithiasis  Estimated Blood Loss: Minimal         Drains: none         Specimens: Gallbladder           Complications: None; patient tolerated the procedure well.         Disposition: PACU - hemodynamically stable.         Condition: stable  Imogene Burn. Georgette Dover, MD, [facility]  General Surgery   06/01/2022 10:05 AM

## 2022-06-02 ENCOUNTER — Encounter (HOSPITAL_COMMUNITY): Payer: Self-pay | Admitting: Surgery

## 2022-06-09 LAB — SURGICAL PATHOLOGY

## 2022-06-28 ENCOUNTER — Other Ambulatory Visit: Payer: Self-pay | Admitting: Family

## 2022-06-28 DIAGNOSIS — F411 Generalized anxiety disorder: Secondary | ICD-10-CM

## 2022-07-28 ENCOUNTER — Ambulatory Visit: Payer: 59 | Admitting: Family

## 2022-07-28 ENCOUNTER — Encounter: Payer: Self-pay | Admitting: Family

## 2022-07-28 ENCOUNTER — Ambulatory Visit (INDEPENDENT_AMBULATORY_CARE_PROVIDER_SITE_OTHER): Payer: 59 | Admitting: Family

## 2022-07-28 VITALS — BP 133/87 | HR 84 | Temp 98.3°F | Ht 63.0 in | Wt 146.0 lb

## 2022-07-28 DIAGNOSIS — H6991 Unspecified Eustachian tube disorder, right ear: Secondary | ICD-10-CM

## 2022-07-28 DIAGNOSIS — H9201 Otalgia, right ear: Secondary | ICD-10-CM

## 2022-07-28 DIAGNOSIS — F411 Generalized anxiety disorder: Secondary | ICD-10-CM

## 2022-07-28 DIAGNOSIS — F1320 Sedative, hypnotic or anxiolytic dependence, uncomplicated: Secondary | ICD-10-CM

## 2022-07-28 DIAGNOSIS — J019 Acute sinusitis, unspecified: Secondary | ICD-10-CM

## 2022-07-28 DIAGNOSIS — K2101 Gastro-esophageal reflux disease with esophagitis, with bleeding: Secondary | ICD-10-CM

## 2022-07-28 MED ORDER — FLUTICASONE PROPIONATE 50 MCG/ACT NA SUSP: 2.0000 | Freq: Every day | NASAL | 6 refills | Status: DC

## 2022-07-28 MED ORDER — FAMOTIDINE 40 MG PO TABS: 40.0000 mg | ORAL_TABLET | Freq: Two times a day (BID) | ORAL | 3 refills | Status: DC | PRN

## 2022-07-28 MED ORDER — LORATADINE 10 MG PO TABS: 10.0000 mg | ORAL_TABLET | Freq: Every day | ORAL | 11 refills | Status: DC

## 2022-07-28 MED ORDER — DOXYCYCLINE HYCLATE 100 MG PO TABS: 100.0000 mg | ORAL_TABLET | Freq: Two times a day (BID) | ORAL | 0 refills | Status: DC

## 2022-07-28 MED ORDER — ALPRAZOLAM 0.25 MG PO TABS: 0.2500 mg | ORAL_TABLET | Freq: Every evening | ORAL | 2 refills | Status: DC | PRN

## 2022-07-28 NOTE — Patient Instructions (Signed)
Eustachian Tube Dysfunction  Eustachian tube dysfunction refers to a condition in which a blockage develops in the narrow passage that connects the middle ear to the back of the nose (eustachian tube). The eustachian tube regulates air pressure in the middle ear by letting air move between the ear and nose. It also helps to drain fluid from the middle ear space. Eustachian tube dysfunction can affect one or both ears. When the eustachian tube does not function properly, air pressure, fluid, or both can build up in the middle ear. What are the causes? This condition occurs when the eustachian tube becomes blocked or cannot open normally. Common causes of this condition include: Ear infections. Colds and other infections that affect the nose, mouth, and throat (upper respiratory tract). Allergies. Irritation from cigarette smoke. Irritation from stomach acid coming up into the esophagus (gastroesophageal reflux). The esophagus is the part of the body that moves food from the mouth to the stomach. Sudden changes in air pressure, such as from descending in an airplane or scuba diving. Abnormal growths in the nose or throat, such as: Growths that line the nose (nasal polyps). Abnormal growth of cells (tumors). Enlarged tissue at the back of the throat (adenoids). What increases the risk? You are more likely to develop this condition if: You smoke. You are overweight. You are a child who has: Certain birth defects of the mouth, such as cleft palate. Large tonsils or adenoids. What are the signs or symptoms? Common symptoms of this condition include: A feeling of fullness in the ear. Ear pain. Clicking or popping noises in the ear. Ringing in the ear (tinnitus). Hearing loss. Loss of balance. Dizziness. Symptoms may get worse when the air pressure around you changes, such as when you travel to an area of high elevation, fly on an airplane, or go scuba diving. How is this diagnosed? This  condition may be diagnosed based on: Your symptoms. A physical exam of your ears, nose, and throat. Tests, such as those that measure: The movement of your eardrum. Your hearing (audiometry). How is this treated? Treatment depends on the cause and severity of your condition. In mild cases, you may relieve your symptoms by moving air into your ears. This is called "popping the ears." In more severe cases, or if you have symptoms of fluid in your ears, treatment may include: Medicines to relieve congestion (decongestants). Medicines that treat allergies (antihistamines). Nasal sprays or ear drops that contain medicines that reduce swelling (steroids). A procedure to drain the fluid in your eardrum. In this procedure, a small tube may be placed in the eardrum to: Drain the fluid. Restore the air in the middle ear space. A procedure to insert a balloon device through the nose to inflate the opening of the eustachian tube (balloon dilation). Follow these instructions at home: Lifestyle Do not do any of the following until your health care provider approves: Travel to high altitudes. Fly in airplanes. Work in a pressurized cabin or room. Scuba dive. Do not use any products that contain nicotine or tobacco. These products include cigarettes, chewing tobacco, and vaping devices, such as e-cigarettes. If you need help quitting, ask your health care provider. Keep your ears dry. Wear fitted earplugs during showering and bathing. Dry your ears completely after. General instructions Take over-the-counter and prescription medicines only as told by your health care provider. Use techniques to help pop your ears as recommended by your health care provider. These may include: Chewing gum. Yawning. Frequent, forceful swallowing.   Closing your mouth, holding your nose closed, and gently blowing as if you are trying to blow air out of your nose. Keep all follow-up visits. This is important. Contact a  health care provider if: Your symptoms do not go away after treatment. Your symptoms come back after treatment. You are unable to pop your ears. You have: A fever. Pain in your ear. Pain in your head or neck. Fluid draining from your ear. Your hearing suddenly changes. You become very dizzy. You lose your balance. Get help right away if: You have a sudden, severe increase in any of your symptoms. Summary Eustachian tube dysfunction refers to a condition in which a blockage develops in the eustachian tube. It can be caused by ear infections, allergies, inhaled irritants, or abnormal growths in the nose or throat. Symptoms may include ear pain or fullness, hearing loss, or ringing in the ears. Mild cases are treated with techniques to unblock the ears, such as yawning or chewing gum. More severe cases are treated with medicines or procedures. This information is not intended to replace advice given to you by your health care provider. Make sure you discuss any questions you have with your health care provider. Document Revised: 05/18/2020 Document Reviewed: 05/18/2020 Elsevier Patient Education  2023 Elsevier Inc.  

## 2022-07-28 NOTE — Progress Notes (Signed)
Subjective:    Patient ID: Gabrielle Weber, female    DOB: 19-Apr-1990, 32 y.o.   MRN: [id number]  Chief Complaint  Patient presents with   Medical Management of Chronic Issues    Wants refill for xanax and pepcid   Ear Pain    Wants antibiotic for ear pain/sinus infection. Going out of town and would like something before.   seasonal allergies    Discuss allergy medicine. Change allergy medicine    Pt presents to the office today for chronic follow up. She currently takes xanax as needed such as before flying or panic attacks. One prescription usually lasts her several months.    She had her gallbladder removed 06/01/22.   She is getting ready to fly to [place] for an [place] cruise.  Gastroesophageal Reflux She complains of belching, heartburn and nausea. This is a chronic problem. The current episode started more than 1 year ago. The problem occurs occasionally. She has tried an herbal remedy and an antacid for the symptoms. The treatment provided moderate relief.  Anxiety Presents for follow-up visit. Symptoms include excessive worry, nausea and restlessness. Symptoms occur occasionally. The severity of symptoms is mild.    Otalgia  There is pain in the right ear. This is a new problem. The current episode started 1 to 4 weeks ago. There has been no fever. The pain is at a severity of 5/10. The pain is mild. Associated symptoms include headaches. Associated symptoms comments: Sinus pressure . She has tried acetaminophen (zyrtec) for the symptoms. The treatment provided mild relief.      Review of Systems  HENT:  Positive for ear pain.   Gastrointestinal:  Positive for heartburn and nausea.  Neurological:  Positive for headaches.  All other systems reviewed and are negative.      Objective:   Physical Exam Vitals reviewed.  Constitutional:      General: She is not in acute distress.    Appearance: She is well-developed.  HENT:     Head: Normocephalic and atraumatic.      Right Ear: A middle ear effusion is present.     Left Ear: Tympanic membrane normal.  Eyes:     Pupils: Pupils are equal, round, and reactive to light.  Neck:     Thyroid: No thyromegaly.  Cardiovascular:     Rate and Rhythm: Normal rate and regular rhythm.     Heart sounds: Normal heart sounds. No murmur heard. Pulmonary:     Effort: Pulmonary effort is normal. No respiratory distress.     Breath sounds: Normal breath sounds. No wheezing.  Abdominal:     General: Bowel sounds are normal. There is no distension.     Palpations: Abdomen is soft.     Tenderness: There is no abdominal tenderness.  Musculoskeletal:        General: No tenderness. Normal range of motion.     Cervical back: Normal range of motion and neck supple.  Skin:    General: Skin is warm and dry.  Neurological:     Mental Status: She is alert and oriented to person, place, and time.     Cranial Nerves: No cranial nerve deficit.     Deep Tendon Reflexes: Reflexes are normal and symmetric.  Psychiatric:        Behavior: Behavior normal.        Thought Content: Thought content normal.        Judgment: Judgment normal.       BP 133/87  Pulse 84   Temp 98.3 F (36.8 C) (Temporal)   Ht 5\' 3"  (1.6 m)   Wt 146 lb (66.2 kg)   SpO2 100%   BMI 25.86 kg/m      Assessment & Plan:  Gabrielle Weber comes in today with chief complaint of Medical Management of Chronic Issues (Wants refill for xanax and pepcid), Ear Pain (Wants antibiotic for ear pain/sinus infection. Going out of town and would like something before.), and seasonal allergies (Discuss allergy medicine. Change allergy medicine )   Diagnosis and orders addressed:  1. GAD (generalized anxiety disorder) - ALPRAZolam (XANAX) 0.25 MG tablet; Take 1 tablet (0.25 mg total) by mouth at bedtime as needed for anxiety.  Dispense: 20 tablet; Refill: 2  2. Gastroesophageal reflux disease with esophagitis and hemorrhage -Diet discussed- Avoid fried, spicy,  citrus foods, caffeine and alcohol -Do not eat 2-3 hours before bedtime -Encouraged small frequent meals -Avoid NSAID's - famotidine (PEPCID) 40 MG tablet; Take 1 tablet (40 mg total) by mouth every 12 (twelve) hours as needed for heartburn or indigestion.  Dispense: 60 tablet; Refill: 3  3. Benzodiazepine dependence (HCC)  4. Eustachian tube disorder, right Start Claritin, flonase, nasal decongestant  - loratadine (CLARITIN) 10 MG tablet; Take 1 tablet (10 mg total) by mouth daily.  Dispense: 30 tablet; Refill: 11 - fluticasone (FLONASE) 50 MCG/ACT nasal spray; Place 2 sprays into both nostrils daily.  Dispense: 16 g; Refill: 6  5. Right ear pain - loratadine (CLARITIN) 10 MG tablet; Take 1 tablet (10 mg total) by mouth daily.  Dispense: 30 tablet; Refill: 11 - fluticasone (FLONASE) 50 MCG/ACT nasal spray; Place 2 sprays into both nostrils daily.  Dispense: 16 g; Refill: 6  6. Acute non-recurrent sinusitis, unspecified location Start Claritin, flonase, nasal decongestant , if pain worsens start doxycyline  - doxycycline (VIBRA-TABS) 100 MG tablet; Take 1 tablet (100 mg total) by mouth 2 (two) times daily.  Dispense: 20 tablet; Refill: 0   Labs pending Patient reviewed in [place] controlled database, no flags noted. Contract and drug screen are up to date.  Health Maintenance reviewed Diet and exercise encouraged  Follow up plan: 3 months    Jannifer Rodney, FNP

## 2022-11-30 ENCOUNTER — Other Ambulatory Visit: Payer: Self-pay | Admitting: Family

## 2022-11-30 DIAGNOSIS — F411 Generalized anxiety disorder: Secondary | ICD-10-CM

## 2023-02-06 ENCOUNTER — Other Ambulatory Visit: Payer: Self-pay | Admitting: Family

## 2023-02-06 DIAGNOSIS — K2101 Gastro-esophageal reflux disease with esophagitis, with bleeding: Secondary | ICD-10-CM

## 2023-02-09 ENCOUNTER — Ambulatory Visit (INDEPENDENT_AMBULATORY_CARE_PROVIDER_SITE_OTHER): Payer: BC Managed Care – PPO | Admitting: Nurse Practitioner

## 2023-02-09 ENCOUNTER — Encounter: Payer: Self-pay | Admitting: Nurse Practitioner

## 2023-02-09 ENCOUNTER — Ambulatory Visit: Payer: Self-pay | Admitting: Family

## 2023-02-09 ENCOUNTER — Telehealth: Payer: BC Managed Care – PPO | Admitting: Nurse Practitioner

## 2023-02-09 ENCOUNTER — Ambulatory Visit: Payer: 59

## 2023-02-09 VITALS — BP 115/73 | HR 72 | Temp 98.3°F | Ht 63.0 in | Wt 147.2 lb

## 2023-02-09 DIAGNOSIS — R5383 Other fatigue: Secondary | ICD-10-CM | POA: Insufficient documentation

## 2023-02-09 DIAGNOSIS — J029 Acute pharyngitis, unspecified: Secondary | ICD-10-CM | POA: Diagnosis not present

## 2023-02-09 DIAGNOSIS — R051 Acute cough: Secondary | ICD-10-CM | POA: Diagnosis not present

## 2023-02-09 DIAGNOSIS — F411 Generalized anxiety disorder: Secondary | ICD-10-CM

## 2023-02-09 LAB — VERITOR FLU A/B WAIVED
Influenza A: NEGATIVE
Influenza B: NEGATIVE

## 2023-02-09 LAB — CULTURE, GROUP A STREP

## 2023-02-09 LAB — RAPID STREP SCREEN (MED CTR MEBANE ONLY): Strep Gp A Ag, IA W/Reflex: NEGATIVE

## 2023-02-09 MED ORDER — AZITHROMYCIN 250 MG PO TABS: ORAL_TABLET | ORAL | 0 refills | Status: DC

## 2023-02-09 MED ORDER — BENZONATATE 100 MG PO CAPS: 100.0000 mg | ORAL_CAPSULE | Freq: Three times a day (TID) | ORAL | 0 refills | Status: DC | PRN

## 2023-02-09 NOTE — Telephone Encounter (Signed)
Copied from CRM [id number]. Topic: Clinical - Pink Word Triage >> Feb 09, 2023  8:18 AM Alvino Blood C wrote: Reason for Triage: Temp, cough, body aches   Chief Complaint: Cough x 2 days Symptoms: Cough, tickling in throat, feels like lymph nodes are swollen Frequency: Started 2 days ago Pertinent Negatives: Patient denies fever or body aches Disposition: [] ED /[] Urgent Care (no appt availability in office) / [x] Appointment(In office/virtual)/ []  [facility]/ [] Home Care/ [] Refused Recommended Disposition /[] [facility]/ []  Follow-up with PCP Additional Notes: Patient also reports ear and throat tenderness. Sts that her friend was sick last week as well, and she was with her over the weekend.     Reason for Disposition  Cough  Answer Assessment - Initial Assessment Questions 1. ONSET: "When did the cough begin?"      Started 2 days ago,started as a tickle in her throat  2. SEVERITY: "How bad is the cough today?"      Reports chest congestion and drainage in throat  3. SPUTUM: "Describe the color of your sputum" (none, dry cough; clear, white, yellow, green)     Yellow sputum  4. HEMOPTYSIS: "Are you coughing up any blood?" If so ask: "How much?" (flecks, streaks, tablespoons, etc.)     No   5. DIFFICULTY BREATHING: "Are you having difficulty breathing?" If Yes, ask: "How bad is it?" (e.g., mild, moderate, severe)    - MILD: No SOB at rest, mild SOB with walking, speaks normally in sentences, can lie down, no retractions, pulse < 100.    - MODERATE: SOB at rest, SOB with minimal exertion and prefers to sit, cannot lie down flat, speaks in phrases, mild retractions, audible wheezing, pulse 100-120.    - SEVERE: Very SOB at rest, speaks in single words, struggling to breathe, sitting hunched forward, retractions, pulse > 120      No shortness of breath  6. FEVER: "Do you have a fever?" If Yes, ask: "What is your temperature, how was it measured, and when did it  start?"     No fever  7. CARDIAC HISTORY: "Do you have any history of heart disease?" (e.g., heart attack, congestive heart failure)      No heart disease  8. LUNG HISTORY: "Do you have any history of lung disease?"  (e.g., pulmonary embolus, asthma, emphysema)     No lung history  9. PE RISK FACTORS: "Do you have a history of blood clots?" (or: recent major surgery, recent prolonged travel, bedridden)     GB removed in March  10. OTHER SYMPTOMS: "Do you have any other symptoms?" (e.g., runny nose, wheezing, chest pain)       Ears feel tender and throat tenderness  11. PREGNANCY: "Is there any chance you are pregnant?" "When was your last menstrual period?"       No; LMP was last week  12. TRAVEL: "Have you traveled out of the country in the last month?" (e.g., travel history, exposures)       Friend was sick last week, but seemed fine when hanging out this past weekend.  Protocols used: Cough - Acute Productive-A-AH

## 2023-02-09 NOTE — Progress Notes (Signed)
Virtual Visit via  Note Due to COVID-19 pandemic this visit was conducted virtually. This visit type was conducted due to national recommendations for restrictions regarding the COVID-19 Pandemic (e.g. social distancing, sheltering in place) in an effort to limit this patient's exposure and mitigate transmission in our community. All issues noted in this document were discussed and addressed.  A physical exam was not performed with this format.   I connected with Gabrielle Weber on 02/09/2023 at  by  and verified that I am speaking with the correct person using two identifiers. Gabrielle Weber is currently located at  and  is currently with them during visit. The provider, Martina Sinner, NP is located in their office at time of visit.  I discussed the limitations, risks, security and privacy concerns of performing an evaluation and management service by virtual visit and the availability of in person appointments. I also discussed with the patient that there may be a patient responsible charge related to this service. The patient expressed understanding and agreed to proceed.  Subjective:  Patient ID: Gabrielle Weber, female    DOB: 12-27-1990, 32 y.o.   MRN: [id number]  Chief Complaint:  No chief complaint on file.   HPI: Gabrielle Weber is a 32 y.o. female presenting on 02/09/2023 for No chief complaint on file.   HPI   Relevant past medical, surgical, family, and social history reviewed and updated as indicated.  Allergies and medications reviewed and updated.   Past Medical History:  Diagnosis Date   Anxiety    GERD (gastroesophageal reflux disease)     Past Surgical History:  Procedure Laterality Date   CHOLECYSTECTOMY N/A 06/01/2022   Procedure: LAPAROSCOPIC CHOLECYSTECTOMY WITH INTRAOPERATIVE CHOLANGIOGRAM;  Surgeon: Manus Rudd, MD;  Location: [facility] OR;  Service: General;  Laterality: N/A;    Social History   Socioeconomic History   Marital status: Single    Spouse name:  Not on file   Number of children: Not on file   Years of education: Not on file   Highest education level: Not on file  Occupational History   Not on file  Tobacco Use   Smoking status: Former    Current packs/day: 0.00    Types: Cigarettes    Quit date: 04/21/2022    Years since quitting: 0.8   Smokeless tobacco: Never  Vaping Use   Vaping status: Never Used  Substance and Sexual Activity   Alcohol use: Yes    Comment: socially   Drug use: No   Sexual activity: Not on file  Other Topics Concern   Not on file  Social History Narrative   Not on file   Social Determinants of Health   Financial Resource Strain: Not on file  Food Insecurity: Not on file  Transportation Needs: Not on file  Physical Activity: Not on file  Stress: Not on file  Social Connections: Not on file  Intimate Partner Violence: Not on file    Outpatient Encounter Medications as of 02/09/2023  Medication Sig   acetaminophen (TYLENOL) 325 MG tablet Take 650 mg by mouth every 6 (six) hours as needed.   ALPRAZolam (XANAX) 0.25 MG tablet Take 1 tablet (0.25 mg total) by mouth at bedtime as needed for anxiety.   doxycycline (VIBRA-TABS) 100 MG tablet Take 1 tablet (100 mg total) by mouth 2 (two) times daily.   famotidine (PEPCID) 40 MG tablet Take 1 tablet (40 mg total) by mouth every 12 (twelve) hours as needed for heartburn or indigestion. **  NEEDS TO BE SEEN BEFORE NEXT REFILL**   fluticasone (FLONASE) 50 MCG/ACT nasal spray Place 2 sprays into both nostrils daily.   loratadine (CLARITIN) 10 MG tablet Take 1 tablet (10 mg total) by mouth daily.   No facility-administered encounter medications on file as of 02/09/2023.    Allergies  Allergen Reactions   Augmentin [Amoxicillin-Pot Clavulanate]    Bactrim [Sulfamethoxazole-Trimethoprim]     Review of Systems       Observations/Objective: No vital signs or physical exam, this was a virtual health encounter.  Pt alert and oriented, answers all  questions appropriately, and able to speak in full sentences.    Assessment and Plan: There are no diagnoses linked to this encounter.   Follow Up Instructions: No follow-ups on file.    I discussed the assessment and treatment plan with the patient. The patient was provided an opportunity to ask questions and all were answered. The patient agreed with the plan and demonstrated an understanding of the instructions.   The patient was advised to call back or seek an in-person evaluation if the symptoms worsen or if the condition fails to improve as anticipated.  The above assessment and management plan was discussed with the patient. The patient verbalized understanding of and has agreed to the management plan. Patient is aware to call the clinic if they develop any new symptoms or if symptoms persist or worsen. Patient is aware when to return to the clinic for a follow-up visit. Patient educated on when it is appropriate to go to the emergency department.    I provided  minutes of time during this  encounter.   Arrie Aran Santa Lighter, DNP [facility] [address] [place], [place] [address] [phone] 02/09/2023

## 2023-02-09 NOTE — Progress Notes (Signed)
Acute Office Visit  Subjective:     Patient ID: Gabrielle Weber, female    DOB: 1990/05/01, 32 y.o.   MRN: [id number]  Chief Complaint  Patient presents with   Cough    Symptoms for few days   Sore Throat   Ear Pain   Headache   Fatigue    HPI Gabrielle Weber is a 32 y.o. female February 09, 2023 who complains of congestion, sore throat, productive cough, and myalgias for 3 days. She denies a history of anorexia, chest pain, myalgias, nausea, shortness of breath, and weakness and denies a history of asthma. Patient denies smoke cigarettes. POC flu, strep negative will treat client based on presentation Awaiting for COVID result Active Ambulatory Problems    Diagnosis Date Noted   Hip pain 10/08/2015   GAD (generalized anxiety disorder) 11/10/2015   Benzodiazepine dependence (HCC) 07/12/2018   Gastroesophageal reflux disease with esophagitis and hemorrhage 09/28/2021   Pharyngitis 02/09/2023   Fatigue 02/09/2023   Sore throat 02/09/2023   Acute cough 02/09/2023   Resolved Ambulatory Problems    Diagnosis Date Noted   Streptococcal sore throat 01/06/2015   Past Medical History:  Diagnosis Date   Anxiety    GERD (gastroesophageal reflux disease)     Review of Systems  Constitutional:  Positive for malaise/fatigue. Negative for chills and fever.  HENT:  Positive for congestion and sore throat.   Eyes:  Negative for pain.  Respiratory:  Positive for cough and sputum production.        Yellow  Cardiovascular:  Negative for chest pain and leg swelling.  Gastrointestinal:  Negative for nausea and vomiting.  Musculoskeletal:  Negative for falls.  Skin:  Negative for itching and rash.  Neurological:  Positive for headaches. Negative for dizziness.   Negative unless indicated in HPI    Objective:    BP 115/73   Pulse 72   Temp 98.3 F (36.8 C) (Temporal)   Ht 5\' 3"  (1.6 m)   Wt 147 lb 3.2 oz (66.8 kg)   SpO2 99%   BMI 26.08 kg/m  BP Readings from Last 3 Encounters:   02/09/23 115/73  07/28/22 133/87  06/01/22 120/79   Wt Readings from Last 3 Encounters:  02/09/23 147 lb 3.2 oz (66.8 kg)  07/28/22 146 lb (66.2 kg)  06/01/22 146 lb (66.2 kg)      Physical Exam Vitals and nursing note reviewed.  Constitutional:      General: She is not in acute distress. HENT:     Head: Normocephalic and atraumatic.     Right Ear: Hearing and tympanic membrane normal. Swelling present.     Left Ear: Tympanic membrane, ear canal and external ear normal. There is no impacted cerumen.     Mouth/Throat:     Mouth: Mucous membranes are moist.     Pharynx: Uvula midline. Posterior oropharyngeal erythema present.  Cardiovascular:     Rate and Rhythm: Normal rate and regular rhythm.  Pulmonary:     Effort: Pulmonary effort is normal.     Breath sounds: Normal breath sounds.  Musculoskeletal:        General: Normal range of motion.     Cervical back: Normal range of motion and neck supple.     Right lower leg: No edema.     Left lower leg: No edema.  Skin:    General: Skin is warm and dry.     Findings: No rash.  Neurological:     Mental Status:  She is alert and oriented to person, place, and time. Mental status is at baseline.  Psychiatric:        Mood and Affect: Mood normal.        Behavior: Behavior normal.        Thought Content: Thought content normal.        Judgment: Judgment normal.     No results found for any visits on 02/09/23.      Assessment & Plan:  Acute cough -     Novel Coronavirus, NAA ([facility]) -     Veritor Flu A/B Waived -     Rapid Strep Screen ([facility] ONLY) -     Benzonatate; Take 1 capsule (100 mg total) by mouth 3 (three) times daily as needed.  Dispense: 30 capsule; Refill: 0  Sore throat -     Novel Coronavirus, NAA ([facility]) -     Veritor Flu A/B Waived -     Rapid Strep Screen ([facility] ONLY) -     Azithromycin; As directed  Dispense: 6 tablet; Refill: 0  Fatigue, unspecified type -     Novel  Coronavirus, NAA ([facility]) -     Veritor Flu A/B Waived -     Rapid Strep Screen ([facility] ONLY)  Pharyngitis, unspecified etiology -     Azithromycin; As directed  Dispense: 6 tablet; Refill: 0  Gabrielle Weber is a 32 year old Caucasian female no acute distress Pharyngitis: Start azithromycin No. 6 tablets dispense client instructed to take 2 tabs in first day and continue with 1 tablet daily until done Plan of care flu strep negative client will be treated based on presentation COVID pending Cough: Tessalon Perles No. 30 dispense 3 times daily as needed take with a full glass 8 ounces of water Increase hydration, Tylenol/ibuprofen for fever  Call or return to clinic prn if these symptoms worsen or fail to improve as anticipated.   The above assessment and management plan was discussed with the patient. The patient verbalized understanding of and has agreed to the management plan. Patient is aware to call the clinic if they develop any new symptoms or if symptoms persist or worsen. Patient is aware when to return to the clinic for a follow-up visit. Patient educated on when it is appropriate to go to the emergency department.  Return if symptoms worsen or fail to improve.  Arrie Aran Santa Lighter, DNP [facility] [address] [place], [place] [address] [phone]

## 2023-02-09 NOTE — Telephone Encounter (Signed)
Copied from CRM [id number]. Topic: Appointments - Appointment Scheduling >> Feb 09, 2023  8:08 AM Alvino Blood C wrote: Patient/patient representative is calling to schedule an appointment. Refer to attachments for appointment information.  Duplicate call. See previous note

## 2023-02-10 LAB — NOVEL CORONAVIRUS, NAA: SARS-CoV-2, NAA: NOT DETECTED

## 2023-04-24 ENCOUNTER — Telehealth: Payer: Self-pay

## 2023-04-24 NOTE — Telephone Encounter (Signed)
Copied from CRM [id number]. Topic: Appointments - Transfer of Care >> Apr 24, 2023 12:50 PM Antony Haste wrote: Pt is requesting to transfer FROM: [facility] Pt is requesting to transfer TO: [facility] Reason for requested transfer: establish a new PCP It is the responsibility of the team the patient would like to transfer to (Dr. Durwin Nora) to reach out to the patient if for any reason this transfer is not acceptable.

## 2023-04-24 NOTE — Telephone Encounter (Signed)
Called patient left voicemail to contact office for an new patient appointment

## 2023-06-22 ENCOUNTER — Ambulatory Visit: Admitting: Family

## 2023-06-22 ENCOUNTER — Encounter: Payer: Self-pay | Admitting: Family

## 2023-06-22 VITALS — BP 113/79 | HR 85 | Temp 98.3°F | Ht 63.0 in | Wt 151.0 lb

## 2023-06-22 DIAGNOSIS — H6991 Unspecified Eustachian tube disorder, right ear: Secondary | ICD-10-CM | POA: Diagnosis not present

## 2023-06-22 DIAGNOSIS — H9201 Otalgia, right ear: Secondary | ICD-10-CM

## 2023-06-22 DIAGNOSIS — F1320 Sedative, hypnotic or anxiolytic dependence, uncomplicated: Secondary | ICD-10-CM

## 2023-06-22 DIAGNOSIS — F411 Generalized anxiety disorder: Secondary | ICD-10-CM | POA: Diagnosis not present

## 2023-06-22 DIAGNOSIS — K2101 Gastro-esophageal reflux disease with esophagitis, with bleeding: Secondary | ICD-10-CM

## 2023-06-22 MED ORDER — FAMOTIDINE 40 MG PO TABS: 40.0000 mg | ORAL_TABLET | Freq: Two times a day (BID) | ORAL | 2 refills | Status: DC | PRN

## 2023-06-22 MED ORDER — CETIRIZINE HCL 10 MG PO TABS: 10.0000 mg | ORAL_TABLET | Freq: Every day | ORAL | 1 refills | Status: DC

## 2023-06-22 MED ORDER — FLUTICASONE PROPIONATE 50 MCG/ACT NA SUSP: 2.0000 | Freq: Every day | NASAL | 6 refills | Status: DC

## 2023-06-22 MED ORDER — ALPRAZOLAM 0.25 MG PO TABS: 0.2500 mg | ORAL_TABLET | Freq: Every evening | ORAL | 2 refills | Status: DC | PRN

## 2023-06-22 NOTE — Patient Instructions (Signed)
 Eustachian Tube Dysfunction  Eustachian tube dysfunction refers to a condition in which a blockage develops in the narrow passage that connects the middle ear to the back of the nose (eustachian tube). The eustachian tube regulates air pressure in the middle ear by letting air move between the ear and nose. It also helps to drain fluid from the middle ear space. Eustachian tube dysfunction can affect one or both ears. When the eustachian tube does not function properly, air pressure, fluid, or both can build up in the middle ear. What are the causes? This condition occurs when the eustachian tube becomes blocked or cannot open normally. Common causes of this condition include: Ear infections. Colds and other infections that affect the nose, mouth, and throat (upper respiratory tract). Allergies. Irritation from cigarette smoke. Irritation from stomach acid coming up into the esophagus (gastroesophageal reflux). The esophagus is the part of the body that moves food from the mouth to the stomach. Sudden changes in air pressure, such as from descending in an airplane or scuba diving. Abnormal growths in the nose or throat, such as: Growths that line the nose (nasal polyps). Abnormal growth of cells (tumors). Enlarged tissue at the back of the throat (adenoids). What increases the risk? You are more likely to develop this condition if: You smoke. You are overweight. You are a child who has: Certain birth defects of the mouth, such as cleft palate. Large tonsils or adenoids. What are the signs or symptoms? Common symptoms of this condition include: A feeling of fullness in the ear. Ear pain. Clicking or popping noises in the ear. Ringing in the ear (tinnitus). Hearing loss. Loss of balance. Dizziness. Symptoms may get worse when the air pressure around you changes, such as when you travel to an area of high elevation, fly on an airplane, or go scuba diving. How is this diagnosed? This  condition may be diagnosed based on: Your symptoms. A physical exam of your ears, nose, and throat. Tests, such as those that measure: The movement of your eardrum. Your hearing (audiometry). How is this treated? Treatment depends on the cause and severity of your condition. In mild cases, you may relieve your symptoms by moving air into your ears. This is called "popping the ears." In more severe cases, or if you have symptoms of fluid in your ears, treatment may include: Medicines to relieve congestion (decongestants). Medicines that treat allergies (antihistamines). Nasal sprays or ear drops that contain medicines that reduce swelling (steroids). A procedure to drain the fluid in your eardrum. In this procedure, a small tube may be placed in the eardrum to: Drain the fluid. Restore the air in the middle ear space. A procedure to insert a balloon device through the nose to inflate the opening of the eustachian tube (balloon dilation). Follow these instructions at home: Lifestyle Do not do any of the following until your health care provider approves: Travel to high altitudes. Fly in airplanes. Work in a Estate agent or room. Scuba dive. Do not use any products that contain nicotine or tobacco. These products include cigarettes, chewing tobacco, and vaping devices, such as e-cigarettes. If you need help quitting, ask your health care provider. Keep your ears dry. Wear fitted earplugs during showering and bathing. Dry your ears completely after. General instructions Take over-the-counter and prescription medicines only as told by your health care provider. Use techniques to help pop your ears as recommended by your health care provider. These may include: Chewing gum. Yawning. Frequent, forceful swallowing.  Closing your mouth, holding your nose closed, and gently blowing as if you are trying to blow air out of your nose. Keep all follow-up visits. This is important. Contact a  health care provider if: Your symptoms do not go away after treatment. Your symptoms come back after treatment. You are unable to pop your ears. You have: A fever. Pain in your ear. Pain in your head or neck. Fluid draining from your ear. Your hearing suddenly changes. You become very dizzy. You lose your balance. Get help right away if: You have a sudden, severe increase in any of your symptoms. Summary Eustachian tube dysfunction refers to a condition in which a blockage develops in the eustachian tube. It can be caused by ear infections, allergies, inhaled irritants, or abnormal growths in the nose or throat. Symptoms may include ear pain or fullness, hearing loss, or ringing in the ears. Mild cases are treated with techniques to unblock the ears, such as yawning or chewing gum. More severe cases are treated with medicines or procedures. This information is not intended to replace advice given to you by your health care provider. Make sure you discuss any questions you have with your health care provider. Document Revised: 05/18/2020 Document Reviewed: 05/18/2020 Elsevier Patient Education  2024 [employer].

## 2023-06-22 NOTE — Progress Notes (Signed)
 Subjective:    Patient ID: Gabrielle Weber, female    DOB: January 12, 1991, 33 y.o.   MRN: [id number]  Chief Complaint  Patient presents with   Ear Pain    Both mainly on right side    Medical Management of Chronic Issues   Headache    Right side    Pt presents to the office today for chronic follow up. She currently takes xanax as needed such as before flying or panic attacks. One prescription usually lasts her several months.  Gastroesophageal Reflux She complains of belching, heartburn and nausea. She reports no sore throat. This is a chronic problem. The current episode started more than 1 year ago. The problem occurs occasionally. She has tried an antacid and a histamine-2 antagonist for the symptoms. The treatment provided moderate relief.  Anxiety Presents for follow-up visit. Symptoms include excessive worry, nausea and restlessness. Symptoms occur occasionally. The severity of symptoms is mild.    Otalgia  There is pain in the right ear. This is a new problem. The current episode started in the past 7 days. The problem has been waxing and waning. There has been no fever. The pain is at a severity of 0/10. The patient is experiencing no pain. Associated symptoms include headaches. Pertinent negatives include no hearing loss, rhinorrhea or sore throat. Associated symptoms comments: Sinus pressure, ear congestion . She has tried acetaminophen (zyrtec) for the symptoms. The treatment provided mild relief.  Headache  This is a new problem. The current episode started 1 to 4 weeks ago. The problem occurs intermittently. The pain is mild. Associated symptoms include ear pain and nausea. Pertinent negatives include no hearing loss, rhinorrhea or sore throat.      Review of Systems  HENT:  Positive for ear pain. Negative for hearing loss, rhinorrhea and sore throat.   Gastrointestinal:  Positive for heartburn and nausea.  Neurological:  Positive for headaches.  All other systems reviewed and  are negative.      Objective:   Physical Exam Vitals reviewed.  Constitutional:      General: She is not in acute distress.    Appearance: She is well-developed.  HENT:     Head: Normocephalic and atraumatic.     Right Ear: A middle ear effusion is present.     Left Ear: Tympanic membrane normal.  Eyes:     Pupils: Pupils are equal, round, and reactive to light.  Neck:     Thyroid: No thyromegaly.  Cardiovascular:     Rate and Rhythm: Normal rate and regular rhythm.     Heart sounds: Normal heart sounds. No murmur heard. Pulmonary:     Effort: Pulmonary effort is normal. No respiratory distress.     Breath sounds: Normal breath sounds. No wheezing.  Abdominal:     General: Bowel sounds are normal. There is no distension.     Palpations: Abdomen is soft.     Tenderness: There is no abdominal tenderness.  Musculoskeletal:        General: No tenderness. Normal range of motion.     Cervical back: Normal range of motion and neck supple.  Skin:    General: Skin is warm and dry.  Neurological:     Mental Status: She is alert and oriented to person, place, and time.     Cranial Nerves: No cranial nerve deficit.     Deep Tendon Reflexes: Reflexes are normal and symmetric.  Psychiatric:        Behavior: Behavior normal.  Thought Content: Thought content normal.        Judgment: Judgment normal.       BP 113/79   Pulse 85   Temp 98.3 F (36.8 C) (Temporal)   Ht 5\' 3"  (1.6 m)   Wt 151 lb (68.5 kg)   BMI 26.75 kg/m      Assessment & Plan:  Gabrielle Weber comes in today with chief complaint of Ear Pain (Both mainly on right side ), Medical Management of Chronic Issues, and Headache (Right side )   Diagnosis and orders addressed:  1. GAD (generalized anxiety disorder) (Primary) - ALPRAZolam (XANAX) 0.25 MG tablet; Take 1 tablet (0.25 mg total) by mouth at bedtime as needed for anxiety.  Dispense: 20 tablet; Refill: 2 - CMP14+EGFR - CBC with  Differential/Platelet  2. Gastroesophageal reflux disease with esophagitis and hemorrhage - famotidine (PEPCID) 40 MG tablet; Take 1 tablet (40 mg total) by mouth every 12 (twelve) hours as needed for heartburn or indigestion. **NEEDS TO BE SEEN BEFORE NEXT REFILL**  Dispense: 180 tablet; Refill: 2 - CMP14+EGFR - CBC with Differential/Platelet  3. Eustachian tube disorder, right - fluticasone (FLONASE) 50 MCG/ACT nasal spray; Place 2 sprays into both nostrils daily.  Dispense: 16 g; Refill: 6 - cetirizine (ZYRTEC ALLERGY) 10 MG tablet; Take 1 tablet (10 mg total) by mouth daily.  Dispense: 90 tablet; Refill: 1 - CMP14+EGFR - CBC with Differential/Platelet  4. Right ear pain - fluticasone (FLONASE) 50 MCG/ACT nasal spray; Place 2 sprays into both nostrils daily.  Dispense: 16 g; Refill: 6 - CMP14+EGFR - CBC with Differential/Platelet  5. Benzodiazepine dependence (HCC) - CMP14+EGFR - CBC with Differential/Platelet   Labs pending Patient reviewed in [place] controlled database, no flags noted. Contract and drug screen up dated today.  Continue current medications  Health Maintenance reviewed Diet and exercise encouraged  Follow up plan: 3 months    Jannifer Rodney, FNP

## 2023-06-28 ENCOUNTER — Telehealth (INDEPENDENT_AMBULATORY_CARE_PROVIDER_SITE_OTHER): Admitting: Family Medicine

## 2023-06-28 ENCOUNTER — Telehealth: Payer: Self-pay

## 2023-06-28 ENCOUNTER — Ambulatory Visit: Payer: Self-pay

## 2023-06-28 ENCOUNTER — Encounter: Payer: Self-pay | Admitting: Family Medicine

## 2023-06-28 ENCOUNTER — Telehealth: Payer: Self-pay | Admitting: Urgent Care

## 2023-06-28 DIAGNOSIS — J0100 Acute maxillary sinusitis, unspecified: Secondary | ICD-10-CM

## 2023-06-28 DIAGNOSIS — B9789 Other viral agents as the cause of diseases classified elsewhere: Secondary | ICD-10-CM | POA: Diagnosis not present

## 2023-06-28 MED ORDER — PSEUDOEPHEDRINE-GUAIFENESIN ER 120-1200 MG PO TB12: 1.0000 | ORAL_TABLET | Freq: Two times a day (BID) | ORAL | 99 refills | Status: DC

## 2023-06-28 MED ORDER — LEVOFLOXACIN 500 MG PO TABS: 500.0000 mg | ORAL_TABLET | Freq: Every day | ORAL | 0 refills | Status: DC

## 2023-06-28 NOTE — Telephone Encounter (Signed)
 Appointment today. LS

## 2023-06-28 NOTE — Telephone Encounter (Signed)
 Reached out to patient, appointment scheduled. [facility] 06/28/23

## 2023-06-28 NOTE — Patient Instructions (Signed)
 The patient no-showed for appointment despite this provider sending direct link, reaching out via phone with no response and waiting for at least 10 minutes from appointment time for patient to join. They will be marked as a NS for this appointment/time. It appears this was a duplicate appointment as she was seen virtually earlier this afternoon and did not cancel with telehealth.

## 2023-06-28 NOTE — Progress Notes (Signed)
 Subjective:    Patient ID: Gabrielle Weber, female    DOB: 05-22-1990, 33 y.o.   MRN: [id number]   HPI: Gabrielle Weber is a 33 y.o. female presenting for Symptoms include congestion, facial pain, nasal congestion, non productive cough, post nasal drip and sinus pressure. There is no fever, chills, or sweats. Onset of symptoms was a few days ago, gradually worsening since that time.        06/22/2023    2:39 PM 07/28/2022    2:55 PM 03/29/2022   10:54 AM 02/21/2022    2:57 PM 10/15/2021   10:54 AM  Depression screen PHQ 2/9  Decreased Interest 0 0 0 0 0  Down, Depressed, Hopeless 0 0 0 0 0  PHQ - 2 Score 0 0 0 0 0  Altered sleeping 0 0  0 1  Tired, decreased energy 1 0  1 1  Change in appetite 0 0  2 0  Feeling bad or failure about yourself  0 0  0 0  Trouble concentrating 0 0  0 0  Moving slowly or fidgety/restless 0 0  0 0  Suicidal thoughts 0 0  0 0  PHQ-9 Score 1 0  3 2  Difficult doing work/chores Not difficult at all Not difficult at all  Not difficult at all Not difficult at all     Relevant past medical, surgical, family and social history reviewed and updated as indicated.  Interim medical history since our last visit reviewed. Allergies and medications reviewed and updated.  ROS:  Review of Systems  Constitutional:  Negative for activity change, appetite change, chills and fever.  HENT:  Positive for congestion, postnasal drip, rhinorrhea and sinus pressure. Negative for ear discharge, ear pain, hearing loss, nosebleeds, sneezing and trouble swallowing.   Respiratory:  Negative for chest tightness and shortness of breath.   Cardiovascular:  Negative for chest pain and palpitations.  Skin:  Negative for rash.     Social History   Tobacco Use  Smoking Status Former   Current packs/day: 0.00   Types: Cigarettes   Quit date: 04/21/2022   Years since quitting: 1.1  Smokeless Tobacco Never       Objective:     Wt Readings from Last 3 Encounters:  06/22/23 151 lb  (68.5 kg)  02/09/23 147 lb 3.2 oz (66.8 kg)  07/28/22 146 lb (66.2 kg)     Exam deferred. Video visit performed.   Assessment & Plan:  Acute maxillary sinusitis, recurrence not specified  Other orders -     levoFLOXacin; Take 1 tablet (500 mg total) by mouth daily. For 10 days  Dispense: 10 tablet; Refill: 0 -     Pseudoephedrine-guaiFENesin ER; Take 1 tablet by mouth 2 (two) times daily. For congestion  Dispense: 14 tablet; Refill: PRN      Diagnoses and all orders for this visit:  Acute maxillary sinusitis, recurrence not specified  Other orders -     levofloxacin (LEVAQUIN) 500 MG tablet; Take 1 tablet (500 mg total) by mouth daily. For 10 days -     Pseudoephedrine-Guaifenesin [phone] MG TB12; Take 1 tablet by mouth 2 (two) times daily. For congestion    Virtual Visit  Note  I discussed the limitations, risks, security and privacy concerns of performing an evaluation and management service by video and the availability of in person appointments. The patient was identified with two identifiers. Pt.expressed understanding and agreed to proceed. Pt. Is at home. Dr. Darlyn Read is in  his office.  Follow Up Instructions:   I discussed the assessment and treatment plan with the patient. The patient was provided an opportunity to ask questions and all were answered. The patient agreed with the plan and demonstrated an understanding of the instructions.   The patient was advised to call back or seek an in-person evaluation if the symptoms worsen or if the condition fails to improve as anticipated.      Follow up plan: Return if symptoms worsen or fail to improve.  Mechele Claude, MD [facility]

## 2023-06-28 NOTE — Telephone Encounter (Signed)
  Chief Complaint: sinus pain pressure, headache Symptoms: pain, pressure, congestion Frequency: Ongoing 10 days Pertinent Negatives: Patient denies difficulty breathing  Disposition: [] ED /[x] Urgent Care (no appt availability in office) / [] Appointment(In office/virtual)/ []  [facility]/ [] Home Care/ [] Refused Recommended Disposition /[] [place] [facility]/ []  Follow-up with PCP Additional Notes:  Headache for one week. Sinus pressure under eyes, & nose. Feels fluid in her ears. Using otc without much effect. Has yellow drainage from nose. Temp 99.3. Requesting antibiotics be called into pharmacy, advised she will need an appointment before medication can be prescribed, patient agreeable to visit, no acute visits available today, scheduled virtual urgent care. Patient requesting PCP Neysa Bonito review and consider calling in antibiotic vs urgent care. Patient aware this writer will send message to provider for review, she is aware this writer cannot guarantee follow up today. Patient will attend virtual urgent care this evening unless other recommendation by PCP. Educated on care advice as documented in protocol, patient verbalized understanding.    Message from Barada C sent at 06/28/2023  3:49 PM EDT  Summary: Sinus inf?   Copied From CRM [id number]. Reason for Triage: Pt is experiencing head ache, pressure on her face and ears. She is believing that it is a sinus inf. She stated that she has had this for 10 days and it is not budging. Please advise.      Reason for Disposition  [1] SEVERE pain AND [2] not improved 2 hours after pain medicine  Protocols used: Sinus Pain or Congestion-A-AH

## 2023-06-28 NOTE — Telephone Encounter (Signed)
 Copied from CRM [id number]. Topic: Clinical - Pink Word Triage >> Jun 28, 2023  3:48 PM Gabrielle Weber wrote: Reason for Triage: Pt is experiencing head ache, pressure on her face and ears. She is believing that it is a sinus inf. She stated that she has had this for 10 days and it is not budging. Please advise. This encounter was created in error - please disregard.

## 2023-06-28 NOTE — Progress Notes (Signed)
 The patient no-showed for appointment despite this provider sending direct link, reaching out via phone with no response and waiting for at least 10 minutes from appointment time for patient to join. They will be marked as a NS for this appointment/time. It appears this was a duplicate appointment as she was seen virtually earlier this afternoon and did not cancel with telehealth.   Maretta Bees, PA

## 2023-10-27 ENCOUNTER — Telehealth: Payer: Self-pay | Admitting: Family Medicine

## 2023-10-27 ENCOUNTER — Ambulatory Visit: Payer: Self-pay

## 2023-10-27 DIAGNOSIS — J019 Acute sinusitis, unspecified: Secondary | ICD-10-CM

## 2023-10-27 DIAGNOSIS — B9689 Other specified bacterial agents as the cause of diseases classified elsewhere: Secondary | ICD-10-CM

## 2023-10-27 MED ORDER — DOXYCYCLINE HYCLATE 100 MG PO TABS: 100.0000 mg | ORAL_TABLET | Freq: Two times a day (BID) | ORAL | 0 refills | Status: AC

## 2023-10-27 NOTE — Patient Instructions (Signed)

## 2023-10-27 NOTE — Telephone Encounter (Signed)
 I called and spoke with patient. She is scheduled to see the telehealth provider today at 5 PM

## 2023-10-27 NOTE — Progress Notes (Signed)
 Virtual Visit Consent   Gabrielle Weber, you are scheduled for a virtual visit with a [place] provider today. Just as with appointments in the office, your consent must be obtained to participate. Your consent will be active for this visit and any virtual visit you may have with one of our providers in the next 365 days. If you have a MyChart account, a copy of this consent can be sent to you electronically.  As this is a virtual visit, video technology does not allow for your provider to perform a traditional examination. This may limit your provider's ability to fully assess your condition. If your provider identifies any concerns that need to be evaluated in person or the need to arrange testing (such as labs, EKG, etc.), we will make arrangements to do so. Although advances in technology are sophisticated, we cannot ensure that it will always work on either your end or our end. If the connection with a video visit is poor, the visit may have to be switched to a telephone visit. With either a video or telephone visit, we are not always able to ensure that we have a secure connection.  By engaging in this virtual visit, you consent to the provision of healthcare and authorize for your insurance to be billed (if applicable) for the services provided during this visit. Depending on your insurance coverage, you may receive a charge related to this service.  I need to obtain your verbal consent now. Are you willing to proceed with your visit today? Gabrielle Weber has provided verbal consent on 10/27/2023 for a virtual visit (video or telephone). Loa Lamp, FNP  Date: 10/27/2023 5:03 PM   Virtual Visit via Video Note   I, Loa Lamp, connected with  Gabrielle Weber  ([id number], 1990-04-30) on 10/27/23 at  5:00 PM EDT by a video-enabled telemedicine application and verified that I am speaking with the correct person using two identifiers.  Location: Patient: Virtual Visit Location Patient: Home Provider:  Virtual Visit Location Provider: Home Office   I discussed the limitations of evaluation and management by telemedicine and the availability of in person appointments. The patient expressed understanding and agreed to proceed.    History of Present Illness: Gabrielle Weber is a 33 y.o. who identifies as a female who was assigned female at birth, and is being seen today for rt ear pain with sinus pain and pressure for 3 days. She started doxy last night and sx improved. She is flying tomorrow to leave on a cruise ship. She requests antibiotic to complete what she started.   HPI: HPI  Problems:  Patient Active Problem List   Diagnosis Date Noted   Gastroesophageal reflux disease with esophagitis and hemorrhage 09/28/2021   Benzodiazepine dependence (HCC) 07/12/2018   GAD (generalized anxiety disorder) 11/10/2015    Allergies:  Allergies  Allergen Reactions   Augmentin [Amoxicillin-Pot Clavulanate]    Bactrim [Sulfamethoxazole-Trimethoprim]    Medications:  Current Outpatient Medications:    acetaminophen  (TYLENOL ) 325 MG tablet, Take 650 mg by mouth every 6 (six) hours as needed., Disp: , Rfl:    ALPRAZolam  (XANAX ) 0.25 MG tablet, Take 1 tablet (0.25 mg total) by mouth at bedtime as needed for anxiety., Disp: 20 tablet, Rfl: 2   cetirizine  (ZYRTEC  ALLERGY) 10 MG tablet, Take 1 tablet (10 mg total) by mouth daily., Disp: 90 tablet, Rfl: 1   famotidine  (PEPCID ) 40 MG tablet, Take 1 tablet (40 mg total) by mouth every 12 (twelve) hours as needed for heartburn  or indigestion. **NEEDS TO BE SEEN BEFORE NEXT REFILL**, Disp: 180 tablet, Rfl: 2   fluticasone  (FLONASE ) 50 MCG/ACT nasal spray, Place 2 sprays into both nostrils daily., Disp: 16 g, Rfl: 6   levofloxacin  (LEVAQUIN ) 500 MG tablet, Take 1 tablet (500 mg total) by mouth daily. For 10 days, Disp: 10 tablet, Rfl: 0   Pseudoephedrine -Guaifenesin  [phone] MG TB12, Take 1 tablet by mouth 2 (two) times daily. For congestion, Disp: 14 tablet, Rfl:  PRN  Observations/Objective: Patient is well-developed, well-nourished in no acute distress.  Resting comfortably  at home.  Head is normocephalic, atraumatic.  No labored breathing.  Speech is clear and coherent with logical content.  Patient is alert and oriented at baseline.   Assessment and Plan: 1. Acute bacterial sinusitis (Primary)  Increase fluids, follow up on cruise ship with provider as needed.   Follow Up Instructions: I discussed the assessment and treatment plan with the patient. The patient was provided an opportunity to ask questions and all were answered. The patient agreed with the plan and demonstrated an understanding of the instructions.  A copy of instructions were sent to the patient via MyChart unless otherwise noted below.     The patient was advised to call back or seek an in-person evaluation if the symptoms worsen or if the condition fails to improve as anticipated.    Petr Bontempo, FNP

## 2023-10-27 NOTE — Telephone Encounter (Signed)
 FYI Only or Action Required?: FYI only for provider.  Patient was last seen in primary care on 06/28/2023 by Zollie Lowers, MD.  Called Nurse Triage reporting Dental Pain.  Symptoms began yesterday.  Interventions attempted: OTC medications: ibuprofen and Prescription medications: doxycycline  .  Symptoms are: unchanged.  Triage Disposition: Call Dentist When Office is Open  Patient/caregiver understands and will follow disposition?: Yes   Summary: Antibiotics for molar   Pt's upper right molar is swollen. She would like some antibiotics until she can see her dentist. Her dentist office is closed. Please call pt at [phone]      Reason for Disposition  Lost crown  Answer Assessment - Initial Assessment Questions 1. LOCATION: Which tooth is hurting?  (e.g., right-side/left-side, upper/lower, front/back)     Right molar 2. ONSET: When did the toothache start?  (e.g., hours, days)      yesterday 3. SEVERITY: How bad is the toothache?  (Scale 1-10; mild, moderate or severe)     mild 4. SWELLING: Is there any visible swelling of your face?     No swelling in her face but some swelling around the tooth 5. OTHER SYMPTOMS: Do you have any other symptoms? (e.g., fever)     Earache  Protocols used: Toothache-A-AH

## 2023-12-19 ENCOUNTER — Other Ambulatory Visit: Payer: Self-pay | Admitting: Family

## 2023-12-19 DIAGNOSIS — F411 Generalized anxiety disorder: Secondary | ICD-10-CM

## 2023-12-29 ENCOUNTER — Encounter: Payer: Self-pay | Admitting: Family

## 2023-12-29 ENCOUNTER — Ambulatory Visit (INDEPENDENT_AMBULATORY_CARE_PROVIDER_SITE_OTHER): Admitting: Family

## 2023-12-29 ENCOUNTER — Other Ambulatory Visit (HOSPITAL_COMMUNITY)
Admission: RE | Admit: 2023-12-29 | Discharge: 2023-12-29 | Disposition: A | Discharge status: Home / Self Care | Payer: Self-pay | Source: Ambulatory Visit | Attending: Family | Admitting: Family

## 2023-12-29 VITALS — BP 108/74 | HR 77 | Temp 97.9°F | Ht 63.0 in | Wt 144.4 lb

## 2023-12-29 DIAGNOSIS — Z01411 Encounter for gynecological examination (general) (routine) with abnormal findings: Secondary | ICD-10-CM | POA: Diagnosis not present

## 2023-12-29 DIAGNOSIS — F411 Generalized anxiety disorder: Secondary | ICD-10-CM

## 2023-12-29 DIAGNOSIS — Z01419 Encounter for gynecological examination (general) (routine) without abnormal findings: Secondary | ICD-10-CM | POA: Insufficient documentation

## 2023-12-29 DIAGNOSIS — Z0000 Encounter for general adult medical examination without abnormal findings: Secondary | ICD-10-CM

## 2023-12-29 DIAGNOSIS — F1320 Sedative, hypnotic or anxiolytic dependence, uncomplicated: Secondary | ICD-10-CM | POA: Diagnosis not present

## 2023-12-29 DIAGNOSIS — J301 Allergic rhinitis due to pollen: Secondary | ICD-10-CM

## 2023-12-29 DIAGNOSIS — K2101 Gastro-esophageal reflux disease with esophagitis, with bleeding: Secondary | ICD-10-CM | POA: Diagnosis not present

## 2023-12-29 MED ORDER — FAMOTIDINE 40 MG PO TABS: 40.0000 mg | ORAL_TABLET | Freq: Two times a day (BID) | ORAL | 2 refills | Status: AC | PRN

## 2023-12-29 MED ORDER — FLUTICASONE PROPIONATE 50 MCG/ACT NA SUSP: 2.0000 | Freq: Every day | NASAL | 6 refills | Status: AC

## 2023-12-29 MED ORDER — ALPRAZOLAM 0.25 MG PO TABS: 0.2500 mg | ORAL_TABLET | Freq: Every evening | ORAL | 2 refills | Status: AC | PRN

## 2023-12-29 MED ORDER — CETIRIZINE HCL 10 MG PO TABS: 10.0000 mg | ORAL_TABLET | Freq: Every day | ORAL | 1 refills | Status: AC

## 2023-12-29 NOTE — Patient Instructions (Signed)

## 2023-12-29 NOTE — Progress Notes (Addendum)
 Subjective:    Patient ID: Gabrielle Weber, female    DOB: 1990/08/30, 33 y.o.   MRN: [id number]  Chief Complaint  Patient presents with   Annual Exam   Pt presents to the office today for CPE with pap.   She currently takes xanax  as needed such as before flying or panic attacks. One prescription usually lasts her several months.  Gastroesophageal Reflux She complains of belching and heartburn. This is a chronic problem. The current episode started more than 1 year ago. The problem occurs occasionally. The symptoms are aggravated by certain foods. She has tried an antacid and a histamine-2 antagonist for the symptoms. The treatment provided moderate relief.  Anxiety Presents for follow-up visit. Symptoms include excessive worry and restlessness. Symptoms occur occasionally. The severity of symptoms is mild.    Gynecologic Exam The patient's pertinent negatives include no genital itching, vaginal bleeding or vaginal discharge.      Review of Systems  Gastrointestinal:  Positive for heartburn.  Genitourinary:  Negative for vaginal discharge.  All other systems reviewed and are negative.      Objective:   Physical Exam Vitals reviewed.  Constitutional:      General: She is not in acute distress.    Appearance: She is well-developed.  HENT:     Head: Normocephalic and atraumatic.     Right Ear: Tympanic membrane normal.     Left Ear: Tympanic membrane normal.  Eyes:     Pupils: Pupils are equal, round, and reactive to light.  Neck:     Thyroid: No thyromegaly.  Cardiovascular:     Rate and Rhythm: Normal rate and regular rhythm.     Heart sounds: Normal heart sounds. No murmur heard. Pulmonary:     Effort: Pulmonary effort is normal. No respiratory distress.     Breath sounds: Normal breath sounds. No wheezing.  Chest:  Breasts:    Right: No swelling, bleeding, inverted nipple, mass, nipple discharge, skin change or tenderness.     Left: No swelling, bleeding, inverted  nipple, mass, nipple discharge, skin change or tenderness.  Abdominal:     General: Bowel sounds are normal. There is no distension.     Palpations: Abdomen is soft.     Tenderness: There is no abdominal tenderness.  Genitourinary:    Comments: Bimanual exam- no adnexal masses or tenderness, ovaries nonpalpable   Cervix parous and pink- No discharge  Musculoskeletal:        General: No tenderness. Normal range of motion.     Cervical back: Normal range of motion and neck supple.  Skin:    General: Skin is warm and dry.  Neurological:     Mental Status: She is alert and oriented to person, place, and time.     Cranial Nerves: No cranial nerve deficit.     Deep Tendon Reflexes: Reflexes are normal and symmetric.  Psychiatric:        Behavior: Behavior normal.        Thought Content: Thought content normal.        Judgment: Judgment normal.       BP 108/74   Pulse 77   Temp 97.9 F (36.6 C) (Temporal)   Ht 5' 3 (1.6 m)   Wt 144 lb 6.4 oz (65.5 kg)   SpO2 100%   BMI 25.58 kg/m      Assessment & Plan:  [name] comes in today with chief complaint of Annual Exam   Diagnosis and orders addressed:  1. GAD (generalized anxiety disorder) - ALPRAZolam  (XANAX ) 0.25 MG tablet; Take 1 tablet (0.25 mg total) by mouth at bedtime as needed for anxiety.  Dispense: 20 tablet; Refill: 2 - CMP14+EGFR - CBC with Differential/Platelet  2. Gastroesophageal reflux disease with esophagitis and hemorrhage - famotidine  (PEPCID ) 40 MG tablet; Take 1 tablet (40 mg total) by mouth every 12 (twelve) hours as needed for heartburn or indigestion.  Dispense: 180 tablet; Refill: 2 - CMP14+EGFR - CBC with Differential/Platelet  3. Annual physical exam (Primary)  - Cytology - PAP - CMP14+EGFR - CBC with Differential/Platelet - Lipid panel  4. Gynecologic exam normal - Cytology - PAP - CMP14+EGFR - CBC with Differential/Platelet  5. Benzodiazepine dependence (HCC) - CMP14+EGFR - CBC  with Differential/Platelet  6. Allergic rhinitis due to pollen, unspecified seasonality - cetirizine  (ZYRTEC  ALLERGY) 10 MG tablet; Take 1 tablet (10 mg total) by mouth daily.  Dispense: 90 tablet; Refill: 1   Labs pending Patient reviewed in [place] controlled database, no flags noted. Contract and drug screen up dated today.  Continue current medications  Health Maintenance reviewed Diet and exercise encouraged  Follow up plan: 6 months    Bari Learn, FNP

## 2023-12-30 LAB — CMP14+EGFR
ALT: 11 IU/L (ref 0–32)
AST: 39 IU/L (ref 0–40)
Albumin: 4.5 g/dL (ref 3.9–4.9)
Alkaline Phosphatase: 75 IU/L (ref 41–116)
BUN/Creatinine Ratio: 8 — ABNORMAL LOW (ref 9–23)
BUN: 6 mg/dL (ref 6–20)
Bilirubin Total: 0.8 mg/dL (ref 0.0–1.2)
CO2: 21 mmol/L (ref 20–29)
Calcium: 8.9 mg/dL (ref 8.7–10.2)
Chloride: 104 mmol/L (ref 96–106)
Creatinine, Ser: 0.73 mg/dL (ref 0.57–1.00)
Globulin, Total: 2.7 g/dL (ref 1.5–4.5)
Glucose: 74 mg/dL (ref 70–99)
Potassium: 4 mmol/L (ref 3.5–5.2)
Sodium: 140 mmol/L (ref 134–144)
Total Protein: 7.2 g/dL (ref 6.0–8.5)
eGFR: 112 mL/min/1.73 (ref >59)

## 2023-12-30 LAB — CBC WITH DIFFERENTIAL/PLATELET
Basophils Absolute: 0.1 x10E3/uL (ref 0.0–0.2)
Basos: 1 %
EOS (ABSOLUTE): 0.4 x10E3/uL (ref 0.0–0.4)
Eos: 4 %
Hematocrit: 40.6 % (ref 34.0–46.6)
Hemoglobin: 13.3 g/dL (ref 11.1–15.9)
Immature Grans (Abs): 0 x10E3/uL (ref 0.0–0.1)
Immature Granulocytes: 0 %
Lymphocytes Absolute: 2.8 x10E3/uL (ref 0.7–3.1)
Lymphs: 32 %
MCH: 31.7 pg (ref 26.6–33.0)
MCHC: 32.8 g/dL (ref 31.5–35.7)
MCV: 97 fL (ref 79–97)
Monocytes Absolute: 0.5 x10E3/uL (ref 0.1–0.9)
Monocytes: 5 %
Neutrophils Absolute: 5.2 x10E3/uL (ref 1.4–7.0)
Neutrophils: 58 %
Platelets: 219 x10E3/uL (ref 150–450)
RBC: 4.2 x10E6/uL (ref 3.77–5.28)
RDW: 11.7 % (ref 11.7–15.4)
WBC: 8.9 x10E3/uL (ref 3.4–10.8)

## 2023-12-30 LAB — LIPID PANEL
Chol/HDL Ratio: 4.1 ratio (ref 0.0–4.4)
Cholesterol, Total: 130 mg/dL (ref 100–199)
HDL: 32 mg/dL — ABNORMAL LOW (ref >39)
LDL Chol Calc (NIH): 81 mg/dL (ref 0–99)
Triglycerides: 85 mg/dL (ref 0–149)
VLDL Cholesterol Cal: 17 mg/dL (ref 5–40)

## 2024-01-02 ENCOUNTER — Ambulatory Visit: Payer: Self-pay | Admitting: Family

## 2024-01-03 LAB — CYTOLOGY - PAP
Chlamydia: NEGATIVE
Comment: NEGATIVE
Comment: NEGATIVE
Comment: NEGATIVE
Comment: NORMAL
Diagnosis: NEGATIVE
Diagnosis: REACTIVE
High risk HPV: NEGATIVE
Neisseria Gonorrhea: NEGATIVE
Trichomonas: NEGATIVE

## 2024-04-26 ENCOUNTER — Encounter: Payer: Self-pay | Admitting: *Deleted

## 2024-06-28 ENCOUNTER — Ambulatory Visit: Payer: Self-pay | Admitting: Family

## 2024-07-05 ENCOUNTER — Ambulatory Visit: Payer: Self-pay | Admitting: Family
